# Patient Record
Sex: Female | Born: 1965 | Race: White | Hispanic: No | Marital: Married | Smoking: Current every day smoker
Health system: Southern US, Community
[De-identification: ages and names within clinical notes are randomized; demographics above are authoritative.]

## PROBLEM LIST (undated history)

## (undated) DIAGNOSIS — F411 Generalized anxiety disorder: Secondary | ICD-10-CM

## (undated) DIAGNOSIS — E785 Hyperlipidemia, unspecified: Secondary | ICD-10-CM

## (undated) DIAGNOSIS — F32A Depression, unspecified: Secondary | ICD-10-CM

## (undated) DIAGNOSIS — F329 Major depressive disorder, single episode, unspecified: Secondary | ICD-10-CM

## (undated) HISTORY — DX: Major depressive disorder, single episode, unspecified: F32.9

## (undated) HISTORY — DX: Generalized anxiety disorder: F41.1

## (undated) HISTORY — PX: OTHER SURGICAL HISTORY: SHX169

## (undated) HISTORY — DX: Depression, unspecified: F32.A

## (undated) HISTORY — DX: Hyperlipidemia, unspecified: E78.5

---

## 2002-04-12 ENCOUNTER — Encounter (HOSPITAL_BASED_OUTPATIENT_CLINIC_OR_DEPARTMENT_OTHER): Payer: Self-pay | Admitting: General Surgery

## 2002-04-16 ENCOUNTER — Ambulatory Visit (HOSPITAL_COMMUNITY): Admission: RE | Admit: 2002-04-16 | Discharge: 2002-04-16 | Payer: Self-pay | Admitting: General Surgery

## 2002-04-16 ENCOUNTER — Encounter (INDEPENDENT_AMBULATORY_CARE_PROVIDER_SITE_OTHER): Payer: Self-pay | Admitting: *Deleted

## 2002-10-24 ENCOUNTER — Ambulatory Visit (HOSPITAL_COMMUNITY): Admission: RE | Admit: 2002-10-24 | Discharge: 2002-10-24 | Payer: Self-pay | Admitting: General Surgery

## 2003-02-19 ENCOUNTER — Other Ambulatory Visit: Admission: RE | Admit: 2003-02-19 | Discharge: 2003-02-19 | Payer: Self-pay | Admitting: Family Medicine

## 2006-10-26 ENCOUNTER — Other Ambulatory Visit: Admission: RE | Admit: 2006-10-26 | Discharge: 2006-10-26 | Payer: Self-pay | Admitting: Family Medicine

## 2006-11-08 ENCOUNTER — Encounter: Admission: RE | Admit: 2006-11-08 | Discharge: 2006-11-08 | Payer: Self-pay | Admitting: Family Medicine

## 2007-08-21 ENCOUNTER — Encounter: Admission: RE | Admit: 2007-08-21 | Discharge: 2007-08-21 | Payer: Self-pay | Admitting: Family Medicine

## 2011-05-06 NOTE — Op Note (Signed)
[place]  Patient:    Brianna Sanchez, Brianna Sanchez Visit Number: [id number] MRN: [id number]          Service Type: DSU Location: [facility] 2550 05 Attending Physician:  Sonda Primes Dictated by:   Mardene Celeste. Lurene Shadow, M.D. Proc. Date: 04/16/02 Admit Date:  04/16/2002 Discharge Date: 04/16/2002   CC:         Montey Hora, M.D.   Operative Report  PREOPERATIVE DIAGNOSIS:  Internal and external hemorrhoidal disease.  POSTOPERATIVE DIAGNOSIS:  Internal and external hemorrhoidal disease.  OPERATION PERFORMED:  Examination under anesthesia and proctosigmoidoscopy to 15 cm and hemorrhoidectomy.  SURGEON:  Mardene Celeste. Lurene Shadow, M.D.  ASSISTANT:  Nurse.  ANESTHESIA:  General.  INDICATIONS FOR PROCEDURE:  The patient is a 45 year old woman with a 15-year history of persistent and probably worsening hemorrhoidal disease.  On examination, she has stage 4 hemorrhoids with prolapse in all quadrants of her internal and external hemorrhoids.  She is brought to the operating room now for proctosigmoidoscopy and hemorrhoidectomy.  DESCRIPTION OF PROCEDURE:  Following the induction of a satisfactory general anesthetic with the patient positioned in the prone jackknife position, I inserted the proctosigmoidoscope into the anus and followed it up to approximately 15 cm.  At this point there was a significant amount of retained stool within the sigmoid and the scope could not be advanced further.  The perianal tissue was then prepped and draped to be included in the sterile operative field and the perianal tissue was then infiltrated with 1% Xylocaine with epinephrine 1:200,000.  The operating anoscope was inserted.  The hemorrhoids were removed in three groups in the 5 oclock position, 8 oclock position and the 2 oclock position by infiltrating each hemorrhoid individually with 1% Xylocaine with epinephrine and putting a traction suture at the base of the  hemorrhoid and making an elliptical incision around the hemorrhoid, carrying the incision down over the hemorrhoidal tissues down to the ____________ across the mucocutaneous junction.  The hemorrhoid was then carefully dissected free from the central muscles, removed and forwarded for pathologic evaluation.  Hemostasis was obtained with electrocautery and the mucosa reapproximated with running 2-0 chromic suture down across the mucocutaneous junction.  All there groups of hemorrhoids were treated in the same fashion.  At the end of the procedure, all areas of dissection were checked for hemostasis and noted to be dry. I used strips of Gelfoam soaked in Marcaine with epinephrine to place over each of the incisions.  I then injected the perianal tissues with 0.5% Marcaine with epinephrine 1:200,000. Sterile dressing was then applied.  Anesthetic reversed.  Patient removed from the operating room to the recovery room in stable condition having tolerated the procedure well. Dictated by:   Mardene Celeste. Lurene Shadow, M.D. Attending Physician:  Sonda Primes DD:  04/16/02 TD:  04/16/02 Job: [id number]

## 2012-04-27 DIAGNOSIS — Z0000 Encounter for general adult medical examination without abnormal findings: Secondary | ICD-10-CM | POA: Diagnosis not present

## 2012-04-27 DIAGNOSIS — I10 Essential (primary) hypertension: Secondary | ICD-10-CM | POA: Diagnosis not present

## 2012-04-27 DIAGNOSIS — E559 Vitamin D deficiency, unspecified: Secondary | ICD-10-CM | POA: Diagnosis not present

## 2012-05-01 DIAGNOSIS — Z124 Encounter for screening for malignant neoplasm of cervix: Secondary | ICD-10-CM | POA: Diagnosis not present

## 2012-05-01 DIAGNOSIS — Z01419 Encounter for gynecological examination (general) (routine) without abnormal findings: Secondary | ICD-10-CM | POA: Diagnosis not present

## 2012-07-30 DIAGNOSIS — F329 Major depressive disorder, single episode, unspecified: Secondary | ICD-10-CM | POA: Diagnosis not present

## 2012-07-30 DIAGNOSIS — E785 Hyperlipidemia, unspecified: Secondary | ICD-10-CM | POA: Diagnosis not present

## 2012-07-30 DIAGNOSIS — I10 Essential (primary) hypertension: Secondary | ICD-10-CM | POA: Diagnosis not present

## 2013-03-21 ENCOUNTER — Other Ambulatory Visit: Payer: Self-pay | Admitting: *Deleted

## 2013-03-21 MED ORDER — ALPRAZOLAM 1 MG PO TABS: ORAL_TABLET | ORAL | Status: DC

## 2013-03-21 NOTE — Telephone Encounter (Signed)
Last seen 07/30/12, last  Filled 02/18/13 Must be called into [facility] pharmacy, let pt know when done

## 2013-03-21 NOTE — Telephone Encounter (Signed)
Called rx to [facility] pharmacy

## 2013-03-21 NOTE — Telephone Encounter (Signed)
Please call in rx

## 2013-04-22 ENCOUNTER — Other Ambulatory Visit: Payer: Self-pay | Admitting: *Deleted

## 2013-04-22 MED ORDER — ALPRAZOLAM 1 MG PO TABS: ORAL_TABLET | ORAL | Status: DC

## 2013-04-22 NOTE — Telephone Encounter (Signed)
Rx sent to pharmacy   

## 2013-04-22 NOTE — Telephone Encounter (Signed)
Patient last seen in office on 07-30-12. Rx last filled on 03-21-13 for #60. Please advise. If approved please have nurse phone in to [facility]. Thank you

## 2013-04-23 NOTE — Telephone Encounter (Signed)
Called rx to [facility] 

## 2013-05-24 ENCOUNTER — Telehealth: Payer: Self-pay | Admitting: Nurse Practitioner

## 2013-05-27 ENCOUNTER — Other Ambulatory Visit: Payer: Self-pay | Admitting: Nurse Practitioner

## 2013-05-27 MED ORDER — ALPRAZOLAM 1 MG PO TABS: ORAL_TABLET | ORAL | Status: DC

## 2013-05-27 NOTE — Telephone Encounter (Signed)
rx called in and pt notified. 

## 2013-05-27 NOTE — Telephone Encounter (Signed)
LAST RF 04/26/13. LAST OV 8/13. CALL IN [facility] IF APPROVED. [phone]

## 2013-05-27 NOTE — Telephone Encounter (Signed)
Please call in xanax rx with 2 refills 

## 2013-05-28 NOTE — Telephone Encounter (Signed)
Called in.

## 2013-05-28 NOTE — Telephone Encounter (Signed)
Called to [facility] 

## 2013-06-25 ENCOUNTER — Telehealth: Payer: Self-pay | Admitting: Nurse Practitioner

## 2013-06-27 NOTE — Telephone Encounter (Signed)
Husband has been going to the [place] and is testing positive for Hepatitis B and she is wanting to know if she can come in and be tested for Hepatitis herself. Patient hasnt been seen in office in almost a year so I went ahead and scheduled her for an appt on Monday the 14th with MMM

## 2013-07-01 ENCOUNTER — Ambulatory Visit (INDEPENDENT_AMBULATORY_CARE_PROVIDER_SITE_OTHER): Payer: Medicare Other | Admitting: Nurse Practitioner

## 2013-07-01 ENCOUNTER — Encounter: Payer: Self-pay | Admitting: Nurse Practitioner

## 2013-07-01 VITALS — BP 114/80 | HR 81 | Temp 99.2°F | Ht 66.0 in | Wt 200.0 lb

## 2013-07-01 DIAGNOSIS — F329 Major depressive disorder, single episode, unspecified: Secondary | ICD-10-CM

## 2013-07-01 DIAGNOSIS — R1011 Right upper quadrant pain: Secondary | ICD-10-CM

## 2013-07-01 DIAGNOSIS — Z20828 Contact with and (suspected) exposure to other viral communicable diseases: Secondary | ICD-10-CM

## 2013-07-01 DIAGNOSIS — Z205 Contact with and (suspected) exposure to viral hepatitis: Secondary | ICD-10-CM

## 2013-07-01 DIAGNOSIS — E785 Hyperlipidemia, unspecified: Secondary | ICD-10-CM

## 2013-07-01 DIAGNOSIS — F411 Generalized anxiety disorder: Secondary | ICD-10-CM | POA: Diagnosis not present

## 2013-07-01 MED ORDER — VENLAFAXINE HCL ER 150 MG PO TB24: 150.0000 mg | ORAL_TABLET | Freq: Every day | ORAL | Status: DC

## 2013-07-01 MED ORDER — ALPRAZOLAM 1 MG PO TABS: ORAL_TABLET | ORAL | Status: DC

## 2013-07-01 MED ORDER — ATORVASTATIN CALCIUM 40 MG PO TABS: 40.0000 mg | ORAL_TABLET | Freq: Every day | ORAL | Status: DC

## 2013-07-01 NOTE — Patient Instructions (Signed)
Hepatitis B  Hepatitis B is a viral infection of the liver. Over half the people who become infected with hepatitis B never feel sick. However, some may later develop long-term liver disease (chronic hepatitis). There are 2 phases of the disease: sudden (acute) and longstanding (chronic).  CAUSES  Hepatitis B is caused by the hepatitis B virus (HBV). It can enter the body by sharing needles contaminated with blood from an infected person, by sharing intimate items such as toothbrushes and razors, or by sex with an infected person. A baby can get HBV from its birth mother. A caregiver may also get it from exposure to the blood of an infected patient by way of a cut or needle stick.   SYMPTOMS  Acute Phase  Many cases of acute HBV infection are mild and cause few problems. Some people may not even realize they are sick. Symptoms in others may last a few weeks to several months and include:  · Loss of appetite.  · Feeling very tired.  · Nausea.  · Vomiting.  · Abdominal pain.  · Dark yellow urine.  · Yellow skin and eyes (jaundice).  Chronic Phase  · About 5% of people who get HBV infection become "chronic carriers." They often have no symptoms, but the virus stays in their body. They may spread the virus to others and can get long-term liver disease. The younger a child is when the infection starts, the more likely that child will be a carrier.  · About 25% of chronic HBV carriers get a disease called "chronic active hepatitis." These people may develop scarring of the liver (cirrhosis), liver failure, or liver cancer.  DIAGNOSIS  Your caregiver can do a blood test to see if you have the disease.  TREATMENT  Acute hepatitis B does not usually require any drug treatment. It is important to avoid medicines such as acetaminophen that may cause increasing liver damage.   Treatment with many antiviral drugs is available and recommended for some patients with hepatitis B infection. The goal is to reduce the risk of  progressive chronic liver disease, transmission of infection to others, and other long-term complications such as cirrhosis, liver failure, and liver cancer. Drug treatment is often advised for people with:  · Acute liver failure.  · Clinical complications of cirrhosis.  · Cirrhosis or advanced fibrosis with high serum measurements of viral DNA.  · Reactivation of chronic HBV after chemotherapy or immunosuppression.  Immediate drug treatment is not often advised for patients who have chronic infection but normal liver enzyme tests or patients who have a positive hepatitis B DNA test in blood but no other signs of active infection. Patients may have other circumstances that suggest a need or potential benefit from drug treatment.  Successful treatment currently requires taking treatment drugs over a long period of time. An injected drug (interferon) may be given daily, 3 times a week, or once weekly for up to 1 year. An oral drug treatment plan may require daily dosing for many years or indefinitely, in order to prevent infection reactivation and worsening of liver disease. Side effects from these drugs are common and some may be very serious. Your response to treatment must be carefully monitored by both you and your caregiver throughout the entire treatment period.  PREVENTION  Hepatitis B vaccine is highly effective in preventing a hepatitis B infection. The vaccine is recommended worldwide for all newborns of hepatitis B infected mothers, and in many countries for all newborns. Hepatitis B vaccine is also recommended   in the [place] for other people at higher than normal risk of getting an infection, including:  · Sexually active people with multiple sex partners.  · Homosexual and bisexual men.  · People who live with someone who has hepatitis B.  · Injection drug users.  · Healthcare workers.  · Patients on chronic hemodialysis and patients who need repeated blood or blood product transfusions.  · Patients with  chronic liver disease due to any cause.  · Unvaccinated people traveling to areas with high levels of local HBV infection.  · Patients with diabetes.  Hepatitis B immune globulin (HBIG) is often given with hepatitis B vaccine to people who have been exposed to blood contaminated with HBV. The HBIG protects you from the virus for the first 1 to 3 months. After that, the hepatitis B vaccine takes over and gives you long-term protection. Your caregiver will help you decide whether and when to get these shots following exposure to HBV.  Healthcare workers need to avoid injuries and wear appropriate protective equipment such as gloves, gowns, and face masks when performing invasive medical or nursing procedures.   HOME CARE INSTRUCTIONS   · Rest when you feel tired, and eat when you are hungry.  · Avoid a sexual relationship until advised otherwise by your caregiver.  · Avoid activities that could expose other people to your blood. Examples include sharing a toothbrush, nail clippers, razors, and needles.  · This infection is contagious. Follow your caregiver's instructions in order to avoid spread of the infection.  · Do not take any medicines until your caregiver says it is okay. This includes over-the-counter drugs such as acetominophen that are usually taken for fever or pain.  SEEK IMMEDIATE MEDICAL CARE IF:   · You are unable to eat or drink.  · You feel sick to your stomach (nauseous) or throw up (vomit).  · You feel confused.  · Jaundice becomes more severe.  · You have trouble breathing, a rash, or swelling of the skin, throat, mouth, or face. You may be having an allergic reaction to the medicine in the shot.  · You start twitching or shaking (seizure).  · You become very sleepy or have trouble waking up.  MAKE SURE YOU:   · Understand these instructions.  · Will watch your condition.  · Will get help right away if you are not doing well or get worse.  Document Released: 12/02/2000 Document Revised: 02/27/2012  Document Reviewed: 04/05/2011  ExitCare® Patient Information ©2014 [employer].

## 2013-07-01 NOTE — Progress Notes (Signed)
Subjective:    Patient ID: Brianna Sanchez, female    DOB: 07-04-1966, 47 y.o.   MRN: [id number]  Hyperlipidemia This is a chronic problem. The current episode started more than 1 year ago. The problem is uncontrolled. Recent lipid tests were reviewed and are high. Exacerbating diseases include obesity. She has no history of diabetes or hypothyroidism. There are no known factors aggravating her hyperlipidemia. Pertinent negatives include no focal sensory loss, leg pain, myalgias or shortness of breath. Current antihyperlipidemic treatment includes statins. The current treatment provides moderate improvement of lipids. Compliance problems include adherence to diet and adherence to exercise.  Risk factors for coronary artery disease include obesity and post-menopausal.  Gad/Depression Currently on effexor and xanax- she is doing okay- still doesn't like getting out  Of house and doing things- but says she doesn't want to change anything.  * Her husband is a patient at the [place]- He was told he had signs of hepatoitis despite labs coming back normal and seeing a specialist and them saying he was oK- They told her she needed to be tested. Patient has no symptoms of hepatitis other than intermittent right upper quadrant pain.  Review of Systems  Constitutional: Negative for fever, activity change, appetite change, fatigue and unexpected weight change.  HENT: Negative.   Eyes: Negative.   Respiratory: Negative.  Negative for shortness of breath.   Cardiovascular: Negative.   Gastrointestinal: Negative.   Genitourinary: Negative.   Musculoskeletal: Negative.  Negative for myalgias.  Neurological: Negative.   Hematological: Negative.   Psychiatric/Behavioral: Negative.        Objective:   Physical Exam  Constitutional: She is oriented to person, place, and time. She appears well-developed and well-nourished.  HENT:  Nose: Nose normal.  Mouth/Throat: Oropharynx is clear and moist.  Eyes: EOM are  normal.  Neck: Trachea normal, normal range of motion and full passive range of motion without pain. Neck supple. No JVD present. Carotid bruit is not present. No thyromegaly present.  Cardiovascular: Normal rate, regular rhythm, normal heart sounds and intact distal pulses.  Exam reveals no gallop and no friction rub.   No murmur heard. Pulmonary/Chest: Effort normal and breath sounds normal.  Abdominal: Soft. Bowel sounds are normal. She exhibits no distension and no mass. There is no tenderness (mild right upper quadrant pain on palpation).  Musculoskeletal: Normal range of motion.  Lymphadenopathy:    She has no cervical adenopathy.  Neurological: She is alert and oriented to person, place, and time. She has normal reflexes.  Skin: Skin is warm and dry.  Psychiatric: She has a normal mood and affect. Her behavior is normal. Judgment and thought content normal.     BP 114/80  Pulse 81  Temp(Src) 99.2 F (37.3 C) (Oral)  Ht 5\' 6"  (1.676 m)  Wt 200 lb (90.719 kg)  BMI 32.3 kg/m2      Assessment & Plan:  1. Hyperlipidemia Low fat diet an dexercise - COMPLETE METABOLIC PANEL WITH GFR - NMR Lipoprofile with Lipids - atorvastatin (LIPITOR) 40 MG tablet; Take 1 tablet (40 mg total) by mouth daily.  Dispense: 30 tablet; Refill: 5  2. GAD (generalized anxiety disorder) Stress management - ALPRAZolam (XANAX) 1 MG tablet; Take 1 tab po bid  Dispense: 60 tablet; Refill: 1  3. Depression  - Venlafaxine HCl 150 MG TB24; Take 1 tablet (150 mg total) by mouth daily.  Dispense: 30 each; Refill: 3  4. Exposure to hepatitis  - Hepatitis panel, acute  5.  Abdominal pain, right upper quadrant  Labs pending - Hepatitis panel, acute  Brianna Daphine Deutscher, FNP

## 2013-07-02 LAB — COMPLETE METABOLIC PANEL WITH GFR
ALT: 26 U/L (ref 0–35)
Alkaline Phosphatase: 96 U/L (ref 39–117)
Sodium: 140 mEq/L (ref 135–145)
Total Bilirubin: 0.4 mg/dL (ref 0.3–1.2)
Total Protein: 7.1 g/dL (ref 6.0–8.3)

## 2013-07-02 LAB — NMR LIPOPROFILE WITH LIPIDS
HDL Particle Number: 38.7 umol/L (ref >=30.5)
HDL Size: 8.6 nm — ABNORMAL LOW (ref >=9.2)
HDL-C: 47 mg/dL (ref >=40)
LDL (calc): 75 mg/dL (ref <100)
LP-IR Score: 73 — ABNORMAL HIGH (ref <=45)
Large HDL-P: 4.2 umol/L — ABNORMAL LOW (ref >=4.8)
Triglycerides: 106 mg/dL (ref <150)

## 2013-07-03 ENCOUNTER — Telehealth: Payer: Self-pay | Admitting: Nurse Practitioner

## 2013-07-03 LAB — HEPATITIS PANEL, ACUTE
HCV Ab: NEGATIVE
Hep B C IgM: NEGATIVE
Hepatitis B Surface Ag: NEGATIVE

## 2013-07-04 NOTE — Telephone Encounter (Signed)
Results not available yet.

## 2013-07-04 NOTE — Telephone Encounter (Signed)
Patient notified that results not ready yet

## 2013-07-09 NOTE — Telephone Encounter (Signed)
Discussed lab results with pt  

## 2013-09-26 ENCOUNTER — Other Ambulatory Visit: Payer: Self-pay

## 2013-09-26 DIAGNOSIS — F411 Generalized anxiety disorder: Secondary | ICD-10-CM

## 2013-09-26 MED ORDER — ALPRAZOLAM 1 MG PO TABS: ORAL_TABLET | ORAL | Status: DC

## 2013-09-26 NOTE — Telephone Encounter (Signed)
Last seen 07/01/13  MMM  If approved route to nurse to phone into [facility] [phone]

## 2013-09-26 NOTE — Telephone Encounter (Signed)
Please call in xanax rx with 1 refill 

## 2013-09-26 NOTE — Telephone Encounter (Signed)
Called to [facility] 

## 2013-11-25 ENCOUNTER — Other Ambulatory Visit: Payer: Self-pay | Admitting: *Deleted

## 2013-11-25 DIAGNOSIS — F411 Generalized anxiety disorder: Secondary | ICD-10-CM

## 2013-11-25 MED ORDER — ALPRAZOLAM 1 MG PO TABS: ORAL_TABLET | ORAL | Status: DC

## 2013-11-25 NOTE — Telephone Encounter (Signed)
Please call in xanax rx with 1 refill 

## 2013-11-25 NOTE — Telephone Encounter (Signed)
Last seen 07/01/13, last filled 10/25/13. Pt uses [facility] [phone]

## 2013-11-26 NOTE — Telephone Encounter (Signed)
Refill given to pharmacist at [facility].

## 2013-12-30 ENCOUNTER — Telehealth: Payer: Self-pay | Admitting: Nurse Practitioner

## 2013-12-30 NOTE — Telephone Encounter (Signed)
Apt made

## 2013-12-31 ENCOUNTER — Encounter: Payer: Self-pay | Admitting: Nurse Practitioner

## 2013-12-31 ENCOUNTER — Ambulatory Visit (INDEPENDENT_AMBULATORY_CARE_PROVIDER_SITE_OTHER): Payer: Medicare Other | Admitting: Nurse Practitioner

## 2013-12-31 VITALS — BP 106/55 | HR 77 | Temp 99.0°F | Ht 66.0 in | Wt 200.0 lb

## 2013-12-31 DIAGNOSIS — B353 Tinea pedis: Secondary | ICD-10-CM

## 2013-12-31 MED ORDER — CLOTRIMAZOLE-BETAMETHASONE 1-0.05 % EX CREA: 1.0000 "application " | TOPICAL_CREAM | Freq: Two times a day (BID) | CUTANEOUS | Status: DC

## 2013-12-31 NOTE — Progress Notes (Signed)
   Subjective:    Patient ID: Brianna Sanchez, female    DOB: 04/19/1966, 48 y.o.   MRN: [id number]  HPI Patient here today c/o rash on feet- started about 2-3 years ago and comes and goes- Has been bad for several weeks. Has tried lots of OTC treatments which have kinda made things worse. They itch burn and are cracked open.    Review of Systems  Constitutional: Negative.   Eyes: Negative.   Cardiovascular: Negative.   All other systems reviewed and are negative.       Objective:   Physical Exam  Constitutional: She appears well-developed and well-nourished.  Cardiovascular: Normal rate, regular rhythm and normal heart sounds.   Pulmonary/Chest: Effort normal and breath sounds normal.  Skin: Skin is warm. Rash noted.  Dry cracked open skin around both heels and on bottom of feet. Mild erythema in some places.          Assessment & Plan:   1. Tinea pedis, recurrent    Meds ordered this encounter  Medications  . clotrimazole-betamethasone (LOTRISONE) cream    Sig: Apply 1 application topically 2 (two) times daily.    Dispense:  45 g    Refill:  2    Order Specific Question:  Supervising Provider    Answer:  Chipper Herb [[id number]]   Avoid scratching and PICKING Soak in epsom salt 2X a day Keep feet dry Call if no better and will do derm referral  [place], FNP

## 2013-12-31 NOTE — Patient Instructions (Signed)
Athlete's Foot Athlete's foot (tinea pedis) is a fungal infection of the skin on the feet. It often occurs on the skin between the toes or underneath the toes. It can also occur on the soles of the feet. Athlete's foot is more likely to occur in hot, humid weather. Not washing your feet or changing your socks often enough can contribute to athlete's foot. The infection can spread from person to person (contagious). CAUSES Athlete's foot is caused by a fungus. This fungus thrives in warm, moist places. Most people get athlete's foot by sharing shower stalls, towels, and wet floors with an infected person. People with weakened immune systems, including those with diabetes, may be more likely to get athlete's foot. SYMPTOMS   Itchy areas between the toes or on the soles of the feet.  White, flaky, or scaly areas between the toes or on the soles of the feet.  Tiny, intensely itchy blisters between the toes or on the soles of the feet.  Tiny cuts on the skin. These cuts can develop a bacterial infection.  Thick or discolored toenails. DIAGNOSIS  Your caregiver can usually tell what the problem is by doing a physical exam. Your caregiver may also take a skin sample from the rash area. The skin sample may be examined under a microscope, or it may be tested to see if fungus will grow in the sample. A sample may also be taken from your toenail for testing. TREATMENT  Over-the-counter and prescription medicines can be used to kill the fungus. These medicines are available as powders or creams. Your caregiver can suggest medicines for you. Fungal infections respond slowly to treatment. You may need to continue using your medicine for several weeks. PREVENTION   Do not share towels.  Wear sandals in wet areas, such as shared locker rooms and shared showers.  Keep your feet dry. Wear shoes that allow air to circulate. Wear cotton or wool socks. HOME CARE INSTRUCTIONS   Take medicines as directed by  your caregiver. Do not use steroid creams on athlete's foot.  Keep your feet clean and cool. Wash your feet daily and dry them thoroughly, especially between your toes.  Change your socks every day. Wear cotton or wool socks. In hot climates, you may need to change your socks 2 to 3 times per day.  Wear sandals or canvas tennis shoes with good air circulation.  If you have blisters, soak your feet in Burow's solution or Epsom salts for 20 to 30 minutes, 2 times a day to dry out the blisters. Make sure you dry your feet thoroughly afterward. SEEK MEDICAL CARE IF:   You have a fever.  You have swelling, soreness, warmth, or redness in your foot.  You are not getting better after 7 days of treatment.  You are not completely cured after 30 days.  You have any problems caused by your medicines. MAKE SURE YOU:   Understand these instructions.  Will watch your condition.  Will get help right away if you are not doing well or get worse. Document Released: 12/02/2000 Document Revised: 02/27/2012 Document Reviewed: 09/23/2011 ExitCare Patient Information 2014 [employer].  

## 2014-01-16 ENCOUNTER — Telehealth: Payer: Self-pay | Admitting: Nurse Practitioner

## 2014-01-16 DIAGNOSIS — F32A Depression, unspecified: Secondary | ICD-10-CM

## 2014-01-16 DIAGNOSIS — F329 Major depressive disorder, single episode, unspecified: Secondary | ICD-10-CM

## 2014-01-17 MED ORDER — VENLAFAXINE HCL ER 150 MG PO TB24: 150.0000 mg | ORAL_TABLET | Freq: Every day | ORAL | Status: DC

## 2014-01-17 NOTE — Telephone Encounter (Signed)
done

## 2014-01-22 ENCOUNTER — Other Ambulatory Visit: Payer: Self-pay | Admitting: *Deleted

## 2014-01-22 DIAGNOSIS — F411 Generalized anxiety disorder: Secondary | ICD-10-CM

## 2014-01-22 MED ORDER — ALPRAZOLAM 1 MG PO TABS: ORAL_TABLET | ORAL | Status: DC

## 2014-01-22 MED ORDER — CLOTRIMAZOLE-BETAMETHASONE 1-0.05 % EX CREA: 1.0000 "application " | TOPICAL_CREAM | Freq: Two times a day (BID) | CUTANEOUS | Status: DC

## 2014-01-22 NOTE — Telephone Encounter (Addendum)
Patient last seen in office on 12-31-13. Rx last filled on 12-25-13 for #60. Please advise. If approved please route to [facility] so nurse can phone in to [facility] at [phone]. Clotrimazole last filled on 01-16-14. Please advise on that as well

## 2014-01-22 NOTE — Telephone Encounter (Signed)
rx called into pharmacy

## 2014-01-22 NOTE — Telephone Encounter (Signed)
Please call in xanax with 1 refills 

## 2014-03-18 ENCOUNTER — Other Ambulatory Visit: Payer: Self-pay | Admitting: Nurse Practitioner

## 2014-03-18 ENCOUNTER — Telehealth: Payer: Self-pay | Admitting: Nurse Practitioner

## 2014-03-18 DIAGNOSIS — E785 Hyperlipidemia, unspecified: Secondary | ICD-10-CM

## 2014-03-18 NOTE — Telephone Encounter (Signed)
Hasn't had lab work in ordr to [facility].Patient NTBS for follow up and lab work

## 2014-03-18 NOTE — Telephone Encounter (Signed)
Patient aware.

## 2014-03-18 NOTE — Telephone Encounter (Signed)
Patient is aware has set up an appointment but cant get it until late next week in the afternoon can you put lab work order in so she can get her labs done earlier and she can it

## 2014-03-18 NOTE — Telephone Encounter (Signed)
ok 

## 2014-03-18 NOTE — Telephone Encounter (Signed)
She can wait until apppointment. So won;t have to make 2 trips- she will be okay to be out of lipitor for a few days.

## 2014-03-18 NOTE — Telephone Encounter (Signed)
She wants lab work put in for that morning so she can eat before her appointment because she said she cant go that long with out eating

## 2014-03-20 ENCOUNTER — Other Ambulatory Visit: Payer: Self-pay | Admitting: *Deleted

## 2014-03-20 DIAGNOSIS — E785 Hyperlipidemia, unspecified: Secondary | ICD-10-CM

## 2014-03-20 MED ORDER — ATORVASTATIN CALCIUM 40 MG PO TABS: 40.0000 mg | ORAL_TABLET | Freq: Every day | ORAL | Status: DC

## 2014-03-24 ENCOUNTER — Other Ambulatory Visit (INDEPENDENT_AMBULATORY_CARE_PROVIDER_SITE_OTHER): Payer: Medicare Other

## 2014-03-24 DIAGNOSIS — E785 Hyperlipidemia, unspecified: Secondary | ICD-10-CM | POA: Diagnosis not present

## 2014-03-24 NOTE — Progress Notes (Signed)
Pt came in for labs only 

## 2014-03-26 ENCOUNTER — Encounter: Payer: Self-pay | Admitting: Nurse Practitioner

## 2014-03-26 ENCOUNTER — Ambulatory Visit (INDEPENDENT_AMBULATORY_CARE_PROVIDER_SITE_OTHER): Payer: Medicare Other | Admitting: Nurse Practitioner

## 2014-03-26 VITALS — BP 130/81 | HR 89 | Temp 98.0°F | Ht 66.0 in | Wt 204.0 lb

## 2014-03-26 DIAGNOSIS — F329 Major depressive disorder, single episode, unspecified: Secondary | ICD-10-CM | POA: Diagnosis not present

## 2014-03-26 DIAGNOSIS — F411 Generalized anxiety disorder: Secondary | ICD-10-CM | POA: Diagnosis not present

## 2014-03-26 DIAGNOSIS — E785 Hyperlipidemia, unspecified: Secondary | ICD-10-CM | POA: Diagnosis not present

## 2014-03-26 DIAGNOSIS — F3289 Other specified depressive episodes: Secondary | ICD-10-CM

## 2014-03-26 DIAGNOSIS — F32A Depression, unspecified: Secondary | ICD-10-CM

## 2014-03-26 LAB — CMP14+EGFR
ALK PHOS: 91 IU/L (ref 39–117)
ALT: 24 IU/L (ref 0–32)
AST: 15 IU/L (ref 0–40)
Albumin/Globulin Ratio: 1.9 (ref 1.1–2.5)
Albumin: 4.4 g/dL (ref 3.5–5.5)
BUN / CREAT RATIO: 20 (ref 9–23)
BUN: 14 mg/dL (ref 6–24)
CHLORIDE: 100 mmol/L (ref 97–108)
CO2: 22 mmol/L (ref 18–29)
Calcium: 9.7 mg/dL (ref 8.7–10.2)
Creatinine, Ser: 0.7 mg/dL (ref 0.57–1.00)
GFR, EST AFRICAN AMERICAN: 118 mL/min/{1.73_m2} (ref >59)
GFR, EST NON AFRICAN AMERICAN: 102 mL/min/{1.73_m2} (ref >59)
Globulin, Total: 2.3 g/dL (ref 1.5–4.5)
Glucose: 108 mg/dL — ABNORMAL HIGH (ref 65–99)
Potassium: 4.7 mmol/L (ref 3.5–5.2)
Sodium: 138 mmol/L (ref 134–144)
TOTAL PROTEIN: 6.7 g/dL (ref 6.0–8.5)
Total Bilirubin: 0.4 mg/dL (ref 0.0–1.2)

## 2014-03-26 LAB — NMR, LIPOPROFILE
CHOLESTEROL: 166 mg/dL (ref <200)
HDL Cholesterol by NMR: 48 mg/dL (ref >=40)
HDL Particle Number: 34 umol/L (ref >=30.5)
LDL PARTICLE NUMBER: 1171 nmol/L — AB (ref <1000)
LDL SIZE: 20.7 nm (ref >20.5)
LDLC SERPL CALC-MCNC: 89 mg/dL (ref <100)
LP-IR SCORE: 73 — AB (ref <=45)
SMALL LDL PARTICLE NUMBER: 485 nmol/L (ref <=527)
Triglycerides by NMR: 146 mg/dL (ref <150)

## 2014-03-26 MED ORDER — ALPRAZOLAM 1 MG PO TABS: ORAL_TABLET | ORAL | Status: DC

## 2014-03-26 MED ORDER — CLOTRIMAZOLE-BETAMETHASONE 1-0.05 % EX CREA: 1.0000 "application " | TOPICAL_CREAM | Freq: Two times a day (BID) | CUTANEOUS | Status: DC

## 2014-03-26 MED ORDER — ATORVASTATIN CALCIUM 40 MG PO TABS: 40.0000 mg | ORAL_TABLET | Freq: Every day | ORAL | Status: DC

## 2014-03-26 MED ORDER — VENLAFAXINE HCL ER 150 MG PO TB24: 150.0000 mg | ORAL_TABLET | Freq: Every day | ORAL | Status: DC

## 2014-03-26 NOTE — Progress Notes (Signed)
Subjective:    Patient ID: Brianna Sanchez, female    DOB: [birth year], 48 y.o.   MRN: [id number]  Patient here today for follow up of chronic medical problems. Doing well   Hyperlipidemia This is a chronic problem. The current episode started more than 1 year ago. The problem is uncontrolled. Recent lipid tests were reviewed and are high. Exacerbating diseases include obesity. She has no history of diabetes or hypothyroidism. There are no known factors aggravating her hyperlipidemia. Pertinent negatives include no focal sensory loss, leg pain, myalgias or shortness of breath. Current antihyperlipidemic treatment includes statins. The current treatment provides moderate improvement of lipids. Compliance problems include adherence to diet and adherence to exercise.  Risk factors for coronary artery disease include obesity and post-menopausal.  Gad/Depression Currently on effexor and xanax- she is doing okay- still doesn't like getting out  Of house and doing things- but says she doesn't want to change anything.  * Review of Systems  Constitutional: Negative for fever, activity change, appetite change, fatigue and unexpected weight change.  HENT: Negative.   Eyes: Negative.   Respiratory: Negative.  Negative for shortness of breath.   Cardiovascular: Negative.   Gastrointestinal: Negative.   Genitourinary: Negative.   Musculoskeletal: Negative.  Negative for myalgias.  Neurological: Negative.   Hematological: Negative.   Psychiatric/Behavioral: Negative.        Objective:   Physical Exam  Constitutional: She is oriented to person, place, and time. She appears well-developed and well-nourished.  HENT:  Nose: Nose normal.  Mouth/Throat: Oropharynx is clear and moist.  Eyes: EOM are normal.  Neck: Trachea normal, normal range of motion and full passive range of motion without pain. Neck supple. No JVD present. Carotid bruit is not present. No thyromegaly present.  Cardiovascular: Normal  rate, regular rhythm, normal heart sounds and intact distal pulses.  Exam reveals no gallop and no friction rub.   No murmur heard. Pulmonary/Chest: Effort normal and breath sounds normal.  Abdominal: Soft. Bowel sounds are normal. She exhibits no distension and no mass. There is no tenderness (mild right upper quadrant pain on palpation).  Musculoskeletal: Normal range of motion.  Lymphadenopathy:    She has no cervical adenopathy.  Neurological: She is alert and oriented to person, place, and time. She has normal reflexes.  Skin: Skin is warm and dry.  Psychiatric: She has a normal mood and affect. Her behavior is normal. Judgment and thought content normal.     BP 130/81  Pulse 89  Temp(Src) 98 F (36.7 C) (Oral)  Ht 5\' 6"  (1.676 m)  Wt 204 lb (92.534 kg)  BMI 32.94 kg/m2      Assessment & Plan:   1. Hyperlipidemia   2. GAD (generalized anxiety disorder)   3. Depression     Meds ordered this encounter  Medications  . Venlafaxine HCl 150 MG TB24    Sig: Take 1 tablet (150 mg total) by mouth daily.    Dispense:  30 each    Refill:  5    Order Specific Question:  Supervising Provider    Answer:  Chipper Herb [[id number]]  . ALPRAZolam (XANAX) 1 MG tablet    Sig: Take 1 tab po bid    Dispense:  60 tablet    Refill:  1    Order Specific Question:  Supervising Provider    Answer:  Chipper Herb [[id number]]  . atorvastatin (LIPITOR) 40 MG tablet    Sig: Take 1 tablet (40 mg total) by  mouth daily.    Dispense:  30 tablet    Refill:  5    Order Specific Question:  Supervising Provider    Answer:  Chipper Herb [[id number]]   Patient to schedule mammogram on way out Labs pending Health maintenance reviewed Diet and exercise encouraged Continue all meds Follow up  In 6 months   [place], FNP

## 2014-03-26 NOTE — Patient Instructions (Signed)

## 2014-03-26 NOTE — Addendum Note (Signed)
Addended by: Chevis Pretty on: 03/26/2014 11:52 AM   Modules accepted: Orders

## 2014-04-22 ENCOUNTER — Encounter: Payer: Self-pay | Admitting: *Deleted

## 2014-05-27 ENCOUNTER — Other Ambulatory Visit: Payer: Self-pay | Admitting: *Deleted

## 2014-05-27 DIAGNOSIS — F411 Generalized anxiety disorder: Secondary | ICD-10-CM

## 2014-05-27 NOTE — Telephone Encounter (Signed)
Last seen 03/26/14, last filled 04/28/14. Call into [facility] [phone]

## 2014-05-28 MED ORDER — ALPRAZOLAM 1 MG PO TABS: ORAL_TABLET | ORAL | Status: DC

## 2014-05-28 NOTE — Telephone Encounter (Signed)
Please call in xanax with 1 refills 

## 2014-05-28 NOTE — Telephone Encounter (Signed)
Rx called into [facility]

## 2014-06-02 ENCOUNTER — Encounter: Payer: Self-pay | Admitting: Nurse Practitioner

## 2014-06-02 ENCOUNTER — Ambulatory Visit (INDEPENDENT_AMBULATORY_CARE_PROVIDER_SITE_OTHER): Payer: Medicare Other | Admitting: Nurse Practitioner

## 2014-06-02 ENCOUNTER — Other Ambulatory Visit: Payer: Self-pay | Admitting: Nurse Practitioner

## 2014-06-02 ENCOUNTER — Telehealth: Payer: Self-pay | Admitting: Nurse Practitioner

## 2014-06-02 VITALS — BP 119/80 | HR 80 | Temp 98.8°F | Ht 66.0 in | Wt 200.8 lb

## 2014-06-02 DIAGNOSIS — L408 Other psoriasis: Secondary | ICD-10-CM

## 2014-06-02 DIAGNOSIS — L409 Psoriasis, unspecified: Secondary | ICD-10-CM

## 2014-06-02 MED ORDER — FLURANDRENOLIDE 4 MCG/SQCM EX TAPE: 1.0000 | MEDICATED_TAPE | Freq: Two times a day (BID) | CUTANEOUS | Status: DC

## 2014-06-02 MED ORDER — FLURANDRENOLIDE 0.05 % EX CREA: 1.0000 "application " | TOPICAL_CREAM | Freq: Every day | CUTANEOUS | Status: DC

## 2014-06-02 MED ORDER — TRIAMCINOLONE 0.1 % CREAM:EUCERIN CREAM 1:1: 1.0000 "application " | TOPICAL_CREAM | Freq: Two times a day (BID) | CUTANEOUS | Status: DC

## 2014-06-02 NOTE — Telephone Encounter (Signed)
Pt having continued foot pain appt scheduled

## 2014-06-02 NOTE — Patient Instructions (Signed)
Psoriasis Psoriasis is a common, long-lasting (chronic) inflammation of the skin. It affects both men and women equally, of all ages and all races. Psoriasis cannot be passed from person to person (not contagious). Psoriasis varies from mild to very severe. When severe, it can greatly affect your quality of life. Psoriasis is an inflammatory disorder affecting the skin as well as other organs including the joints (causing an arthritis). With psoriasis, the skin sheds its top layer of cells more rapidly than it does in someone without psoriasis. CAUSES  The cause of psoriasis is largely unknown. Genetics, your immune system, and the environment seem to play a role in causing psoriasis. Factors that can make psoriasis worse include:  Damage or trauma to the skin, such as cuts, scrapes, and sunburn. This damage often causes new areas of psoriasis (lesions).  Winter dryness and lack of sunlight.  Medicines such as lithium, beta-blockers, antimalarial drugs, ACE inhibitors, nonsteroidal anti-inflammatory drugs (ibuprofen, aspirin), and terbinafine. Let your caregiver know if you are taking any of these drugs.  Alcohol. Excessive alcohol use should be avoided if you have psoriasis. Drinking large amounts of alcohol can affect:  How well your psoriasis treatment works.  How safe your psoriasis treatment is.  Smoking. If you smoke, ask your caregiver for help to quit.  Stress.  Bacterial or viral infections.  Arthritis. Arthritis associated with psoriasis (psoriatic arthritis) affects less than 10% of patients with psoriasis. The arthritic intensity does not always match the skin psoriasis intensity. It is important to let your caregiver know if your joints hurt or if they are stiff. SYMPTOMS  The most common form of psoriasis begins with little red bumps that gradually become larger. The bumps begin to form scales that flake off easily. The lower layers of scales stick together. When these scales  are scratched or removed, the underlying skin is tender and bleeds easily. These areas then grow in size and may become large. Psoriasis often creates a rash that looks the same on both sides of the body (symmetrical). It often affects the elbows, knees, groin, genitals, arms, legs, scalp, and nails. Affected nails often have pitting, loosen, thicken, crumble, and are difficult to treat.  "Inverse psoriasis"occurs in the armpits, under breasts, in skin folds, and around the groin, buttocks, and genitals.  "Guttate psoriasis" generally occurs in children and young adults following a recent sore throat (strep throat). It begins with many small, red, scaly spots on the skin. It clears spontaneously in weeks or a few months without treatment. DIAGNOSIS  Psoriasis is diagnosed by physical exam. A tissue sample (biopsy) may also be taken. TREATMENT The treatment of psoriasis depends on your age, health, and living conditions.  Steroid (cortisone) creams, lotions, and ointments may be used. These treatments are associated with thinning of the skin, blood vessels that get larger (dilated), loss of skin pigmentation, and easy bruising. It is important to use these steroids as directed by your caregiver. Only treat the affected areas and not the normal, unaffected skin. People on long-term steroid treatment should wear a medical alert bracelet. Injections may be used in areas that are difficult to treat.  Scalp treatments are available as shampoos, solutions, sprays, foams, and oils. Avoid scratching the scalp and picking at the scales.  Anthralin medicine works well on areas that are difficult to treat. However, it stains clothes and skin and may cause temporary irritation.  Synthetic vitamin D (calcipotriene)can be used on small areas. It is available by prescription. The forms   of synthetic vitamin D available in health food stores do not help with psoriasis.  Coal tarsare available in various strengths  for psoriasis that is difficult to treat. They are one of the longest used treatments for difficult to treat psoriasis. However, they are messy to use.  Light therapy (UV therapy) can be carefully and professionally monitored in a dermatologist's office. Careful sunbathing is helpful for many people as directed by your caregiver. The exposure should be just long enough to cause a mild redness (erythema) of your skin. Avoid sunburn as this may make the condition worse. Sunscreen (SPF of 30 or higher) should be used to protect against sunburn. Cataracts, wrinkles, and skin aging are some of the harmful side effects of light therapy.  If creams (topical medicines) fail, there are several other options for systemic or oral medicines your caregiver can suggest. Psoriasis can sometimes be very difficult to treat. It can come and go. It is necessary to follow up with your caregiver regularly if your psoriasis is difficult to treat. Usually, with persistence you can get a good amount of relief. Maintaining consistent care is important. Do not change caregivers just because you do not see immediate results. It may take several trials to find the right combination of treatment for you. PREVENTING FLARE-UPS  Wear gloves while you wash dishes, while cleaning, and when you are outside in the cold.  If you have radiators, place a bowl of water or damp towel on the radiator. This will help put water back in the air. You can also use a humidifier to keep the air moist. Try to keep the humidity at about 60% in your home.  Apply moisturizer while your skin is still damp from bathing or showering. This traps water in the skin.  Avoid long, hot baths or showers. Keep soap use to a minimum. Soaps dry out the skin and wash away the protective oils. Use a fragrance free, dye free soap.  Drink enough water and fluids to keep your urine clear or pale yellow. Not drinking enough water depletes your skin's water  supply.  Turn off the heat at night and keep it low during the day. Cool air is less drying. SEEK MEDICAL CARE IF:  You have increasing pain in the affected areas.  You have uncontrolled bleeding in the affected areas.  You have increasing redness or warmth in the affected areas.  You start to have pain or stiffness in your joints.  You start feeling depressed about your condition.  You have a fever. Document Released: 12/02/2000 Document Revised: 02/27/2012 Document Reviewed: 05/30/2011 ExitCare Patient Information 2014 [employer].  

## 2014-06-02 NOTE — Progress Notes (Signed)
   Subjective:    Patient ID: [name], female    DOB: 1966/04/12, 48 y.o.   MRN: [id number]  HPI Patient in today c/o bil foot pain- started several months ago- Her skin gets thick and craks open- painful to walk on. Has used lotrisone cream which hs not helped- She has soaked in epsom salt and that has not helped either.    Review of Systems  Constitutional: Negative.   HENT: Negative.   Respiratory: Negative.   Cardiovascular: Negative.   Genitourinary: Negative.   Psychiatric/Behavioral: Negative.   All other systems reviewed and are negative.      Objective:   Physical Exam  Constitutional: She is oriented to person, place, and time. She appears well-developed and well-nourished.  Cardiovascular: Normal rate, regular rhythm and normal heart sounds.   Pulmonary/Chest: Effort normal and breath sounds normal.  Neurological: She is alert and oriented to person, place, and time.  Skin:  Thick dry flaky skin on bil heels with fissure plantar surface of left foot.   BP 119/80  Pulse 80  Temp(Src) 98.8 F (37.1 C) (Oral)  Ht 5\' 6"  (1.676 m)  Wt 200 lb 12.8 oz (91.082 kg)  BMI 32.43 kg/m2        Assessment & Plan:  1. Psoriasis listerine foot soak Use tape at bedtime RTO if not improving  - Flurandrenolide 4 MCG/SQCM TAPE; Apply 1 each topically 2 (two) times daily.  Dispense: 1 each; Refill: 2  [name], FNP

## 2014-06-03 DIAGNOSIS — Z1231 Encounter for screening mammogram for malignant neoplasm of breast: Secondary | ICD-10-CM | POA: Diagnosis not present

## 2014-07-29 ENCOUNTER — Other Ambulatory Visit: Payer: Self-pay | Admitting: *Deleted

## 2014-07-29 DIAGNOSIS — F411 Generalized anxiety disorder: Secondary | ICD-10-CM

## 2014-07-29 MED ORDER — ALPRAZOLAM 1 MG PO TABS: ORAL_TABLET | ORAL | Status: DC

## 2014-07-29 NOTE — Telephone Encounter (Signed)
Called in.

## 2014-07-29 NOTE — Telephone Encounter (Signed)
Last ov 6/15. Last refill 06/26/14. If approved call to [facility] [phone].

## 2014-07-29 NOTE — Telephone Encounter (Signed)
Please call in xanax with 1 refills 

## 2014-08-18 ENCOUNTER — Telehealth: Payer: Self-pay | Admitting: Nurse Practitioner

## 2014-08-18 NOTE — Telephone Encounter (Signed)
appt scheduled

## 2014-08-19 ENCOUNTER — Ambulatory Visit (INDEPENDENT_AMBULATORY_CARE_PROVIDER_SITE_OTHER): Payer: Medicare Other | Admitting: Nurse Practitioner

## 2014-08-19 ENCOUNTER — Encounter: Payer: Self-pay | Admitting: Nurse Practitioner

## 2014-08-19 VITALS — BP 120/84 | HR 95 | Temp 98.0°F | Ht 66.0 in | Wt 198.4 lb

## 2014-08-19 DIAGNOSIS — F3289 Other specified depressive episodes: Secondary | ICD-10-CM

## 2014-08-19 DIAGNOSIS — F329 Major depressive disorder, single episode, unspecified: Secondary | ICD-10-CM

## 2014-08-19 DIAGNOSIS — F32A Depression, unspecified: Secondary | ICD-10-CM

## 2014-08-19 DIAGNOSIS — F411 Generalized anxiety disorder: Secondary | ICD-10-CM

## 2014-08-19 MED ORDER — ALPRAZOLAM 1 MG PO TABS: ORAL_TABLET | ORAL | Status: DC

## 2014-08-19 MED ORDER — VENLAFAXINE HCL ER 150 MG PO CP24: ORAL_CAPSULE | ORAL | Status: DC

## 2014-08-19 NOTE — Patient Instructions (Signed)
Stress and Stress Management Stress is a normal reaction to life events. It is what you feel when life demands more than you are used to or more than you can handle. Some stress can be useful. For example, the stress reaction can help you catch the last bus of the day, study for a test, or meet a deadline at work. But stress that occurs too often or for too long can cause problems. It can affect your emotional health and interfere with relationships and normal daily activities. Too much stress can weaken your immune system and increase your risk for physical illness. If you already have a medical problem, stress can make it worse. CAUSES  All sorts of life events may cause stress. An event that causes stress for one person may not be stressful for another person. Major life events commonly cause stress. These may be positive or negative. Examples include losing your job, moving into a new home, getting married, having a baby, or losing a loved one. Less obvious life events may also cause stress, especially if they occur day after day or in combination. Examples include working long hours, driving in traffic, caring for children, being in debt, or being in a difficult relationship. SIGNS AND SYMPTOMS Stress may cause emotional symptoms including, the following:  Anxiety. This is feeling worried, afraid, on edge, overwhelmed, or out of control.  Anger. This is feeling irritated or impatient.  Depression. This is feeling sad, down, helpless, or guilty.  Difficulty focusing, remembering, or making decisions. Stress may cause physical symptoms, including the following:   Aches and pains. These may affect your head, neck, back, stomach, or other areas of your body.  Tight muscles or clenched jaw.  Low energy or trouble sleeping. Stress may cause unhealthy behaviors, including the following:   Eating to feel better (overeating) or skipping meals.  Sleeping too little, too much, or both.  Working  too much or putting off tasks (procrastination).  Smoking, drinking alcohol, or using drugs to feel better. DIAGNOSIS  Stress is diagnosed through an assessment by your health care provider. Your health care provider will ask questions about your symptoms and any stressful life events.Your health care provider will also ask about your medical history and may order blood tests or other tests. Certain medical conditions and medicine can cause physical symptoms similar to stress. Mental illness can cause emotional symptoms and unhealthy behaviors similar to stress. Your health care provider may refer you to a mental health professional for further evaluation.  TREATMENT  Stress management is the recommended treatment for stress.The goals of stress management are reducing stressful life events and coping with stress in healthy ways.  Techniques for reducing stressful life events include the following:  Stress identification. Self-monitor for stress and identify what causes stress for you. These skills may help you to avoid some stressful events.  Time management. Set your priorities, keep a calendar of events, and learn to say "no." These tools can help you avoid making too many commitments. Techniques for coping with stress include the following:  Rethinking the problem. Try to think realistically about stressful events rather than ignoring them or overreacting. Try to find the positives in a stressful situation rather than focusing on the negatives.  Exercise. Physical exercise can release both physical and emotional tension. The key is to find a form of exercise you enjoy and do it regularly.  Relaxation techniques. These relax the body and mind. Examples include yoga, meditation, tai chi, biofeedback, deep  breathing, progressive muscle relaxation, listening to music, being out in nature, journaling, and other hobbies. Again, the key is to find one or more that you enjoy and can do  regularly.  Healthy lifestyle. Eat a balanced diet, get plenty of sleep, and do not smoke. Avoid using alcohol or drugs to relax.  Strong support network. Spend time with family, friends, or other people you enjoy being around.Express your feelings and talk things over with someone you trust. Counseling or talktherapy with a mental health professional may be helpful if you are having difficulty managing stress on your own. Medicine is typically not recommended for the treatment of stress.Talk to your health care provider if you think you need medicine for symptoms of stress. HOME CARE INSTRUCTIONS  Keep all follow-up visits as directed by your health care provider.  Take all medicines as directed by your health care provider. SEEK MEDICAL CARE IF:  Your symptoms get worse or you start having new symptoms.  You feel overwhelmed by your problems and can no longer manage them on your own. SEEK IMMEDIATE MEDICAL CARE IF:  You feel like hurting yourself or someone else. Document Released: 05/31/2001 Document Revised: 04/21/2014 Document Reviewed: 07/30/2013 ExitCare Patient Information 2015 [employer]. This information is not intended to replace advice given to you by your health care provider. Make sure you discuss any questions you have with your health care provider.  

## 2014-08-19 NOTE — Progress Notes (Signed)
   Subjective:    Patient ID: Brianna Sanchez, female    DOB: 1966-01-02, 48 y.o.   MRN: [id number]  HPI patient in c/o of worsening depression and anxiety- Her husband moved out Sunday and she is really stresses- not sure if he is having an affair or not. They have been having problems off and on for years. Patient has been dealing with anxiety for years. SHe is currently on venlafaxine and xanax.     Review of Systems  Constitutional: Negative.   HENT: Negative.   Respiratory: Negative.   Cardiovascular: Negative.   Genitourinary: Negative.   Neurological: Negative.   Psychiatric/Behavioral: Negative.   All other systems reviewed and are negative.      Objective:   Physical Exam  Constitutional: She is oriented to person, place, and time. She appears well-developed and well-nourished.  Cardiovascular: Normal rate, regular rhythm and normal heart sounds.   Pulmonary/Chest: Effort normal and breath sounds normal.  Neurological: She is alert and oriented to person, place, and time.  Skin: Skin is warm.  Psychiatric: She has a normal mood and affect. Her behavior is normal. Judgment and thought content normal.  Tearful throughout exam.    BP 120/84  Pulse 95  Temp(Src) 98 F (36.7 C) (Oral)  Ht 5\' 6"  (1.676 m)  Wt 198 lb 6.4 oz (89.994 kg)  BMI 32.04 kg/m2]      Assessment & Plan:   1. Depression   2. GAD (generalized anxiety disorder)    Meds ordered this encounter  Medications  . venlafaxine XR (EFFEXOR-XR) 150 MG 24 hr capsule    Sig: 2 po qd    Dispense:  60 capsule    Refill:  3    Order Specific Question:  Supervising Provider    Answer:  Chipper Herb [[id number]]  . ALPRAZolam (XANAX) 1 MG tablet    Sig: Take 1 tab po bid    Dispense:  60 tablet    Refill:  0    Do not fill till 09/27/14\    Order Specific Question:  Supervising Provider    Answer:  Chipper Herb [[id number]]   Increased effexor to 2 po qd Do  Not want to increase xanax at this time Need  to try to work through problems  [place], FNP

## 2014-09-26 ENCOUNTER — Encounter: Payer: Self-pay | Admitting: Nurse Practitioner

## 2014-09-26 ENCOUNTER — Ambulatory Visit (INDEPENDENT_AMBULATORY_CARE_PROVIDER_SITE_OTHER): Payer: Medicare Other | Admitting: Nurse Practitioner

## 2014-09-26 VITALS — BP 125/98 | HR 76 | Temp 97.5°F | Ht 66.0 in | Wt 201.0 lb

## 2014-09-26 DIAGNOSIS — F329 Major depressive disorder, single episode, unspecified: Secondary | ICD-10-CM | POA: Diagnosis not present

## 2014-09-26 DIAGNOSIS — E785 Hyperlipidemia, unspecified: Secondary | ICD-10-CM

## 2014-09-26 DIAGNOSIS — F32A Depression, unspecified: Secondary | ICD-10-CM

## 2014-09-26 DIAGNOSIS — F411 Generalized anxiety disorder: Secondary | ICD-10-CM

## 2014-09-26 MED ORDER — ATORVASTATIN CALCIUM 40 MG PO TABS: 40.0000 mg | ORAL_TABLET | Freq: Every day | ORAL | Status: DC

## 2014-09-26 MED ORDER — ALPRAZOLAM 1 MG PO TABS: ORAL_TABLET | ORAL | Status: DC

## 2014-09-26 NOTE — Progress Notes (Signed)
Subjective:    Patient ID: Brianna Sanchez, female    DOB: [birth year], 48 y.o.   MRN: [id number]  Patient here today for follow up of chronic medical problems. Doing well   Hyperlipidemia This is a chronic problem. The current episode started more than 1 year ago. The problem is uncontrolled. Recent lipid tests were reviewed and are high. Exacerbating diseases include obesity. She has no history of diabetes or hypothyroidism. There are no known factors aggravating her hyperlipidemia. Pertinent negatives include no focal sensory loss, leg pain, myalgias or shortness of breath. Current antihyperlipidemic treatment includes statins. The current treatment provides moderate improvement of lipids. Compliance problems include adherence to diet and adherence to exercise.  Risk factors for coronary artery disease include obesity and post-menopausal.  Anxiety Presents for follow-up visit. Symptoms include nervous/anxious behavior, palpitations and panic. Patient reports no shortness of breath or suicidal ideas. Symptoms occur most days. The symptoms are aggravated by family issues. The quality of sleep is fair. Nighttime awakenings: occasional.   Her past medical history is significant for depression. The treatment provided moderate relief. Compliance with prior treatments has been good.  Gad/Depression Currently on effexor ( was increased to 372m at last visit) and xanax- she is doing okay- still doesn't like getting out  Of house and doing things- She is some better since last visit.  * Review of Systems  Constitutional: Negative for fever, activity change, appetite change, fatigue and unexpected weight change.  HENT: Negative.   Eyes: Negative.   Respiratory: Negative.  Negative for shortness of breath.   Cardiovascular: Positive for palpitations.  Gastrointestinal: Negative.   Genitourinary: Negative.   Musculoskeletal: Negative.  Negative for myalgias.  Neurological: Negative.   Hematological:  Negative.   Psychiatric/Behavioral: Negative for suicidal ideas. The patient is nervous/anxious.        Objective:   Physical Exam  Constitutional: She is oriented to person, place, and time. She appears well-developed and well-nourished.  HENT:  Nose: Nose normal.  Mouth/Throat: Oropharynx is clear and moist.  Eyes: EOM are normal.  Neck: Trachea normal, normal range of motion and full passive range of motion without pain. Neck supple. No JVD present. Carotid bruit is not present. No thyromegaly present.  Cardiovascular: Normal rate, regular rhythm, normal heart sounds and intact distal pulses.  Exam reveals no gallop and no friction rub.   No murmur heard. Pulmonary/Chest: Effort normal and breath sounds normal.  Abdominal: Soft. Bowel sounds are normal. She exhibits no distension and no mass. There is no tenderness (mild right upper quadrant pain on palpation).  Musculoskeletal: Normal range of motion.  Lymphadenopathy:    She has no cervical adenopathy.  Neurological: She is alert and oriented to person, place, and time. She has normal reflexes.  Skin: Skin is warm and dry.  Psychiatric: She has a normal mood and affect. Her behavior is normal. Judgment and thought content normal.     BP 125/98  Pulse 76  Temp(Src) 97.5 F (36.4 C) (Oral)  Ht '5\' 6"'  (1.676 m)  Wt 201 lb (91.173 kg)  BMI 32.46 kg/m2      Assessment & Plan:   1. Hyperlipidemia  - atorvastatin (LIPITOR) 40 MG tablet; Take 1 tablet (40 mg total) by mouth daily.  Dispense: 30 tablet; Refill: 5 - CMP14+EGFR - NMR, lipoprofile  2. GAD (generalized anxiety disorder)  - ALPRAZolam (XANAX) 1 MG tablet; Take 1 tab po bid  Dispense: 60 tablet; Refill: 0  3. Depression  Stress management Labs  pending Health maintenance reviewed Diet and exercise encouraged Continue all meds Follow up  In 3 months   [place], FNP

## 2014-09-26 NOTE — Patient Instructions (Signed)
Stress and Stress Management Stress is a normal reaction to life events. It is what you feel when life demands more than you are used to or more than you can handle. Some stress can be useful. For example, the stress reaction can help you catch the last bus of the day, study for a test, or meet a deadline at work. But stress that occurs too often or for too long can cause problems. It can affect your emotional health and interfere with relationships and normal daily activities. Too much stress can weaken your immune system and increase your risk for physical illness. If you already have a medical problem, stress can make it worse. CAUSES  All sorts of life events may cause stress. An event that causes stress for one person may not be stressful for another person. Major life events commonly cause stress. These may be positive or negative. Examples include losing your job, moving into a new home, getting married, having a baby, or losing a loved one. Less obvious life events may also cause stress, especially if they occur day after day or in combination. Examples include working long hours, driving in traffic, caring for children, being in debt, or being in a difficult relationship. SIGNS AND SYMPTOMS Stress may cause emotional symptoms including, the following:  Anxiety. This is feeling worried, afraid, on edge, overwhelmed, or out of control.  Anger. This is feeling irritated or impatient.  Depression. This is feeling sad, down, helpless, or guilty.  Difficulty focusing, remembering, or making decisions. Stress may cause physical symptoms, including the following:   Aches and pains. These may affect your head, neck, back, stomach, or other areas of your body.  Tight muscles or clenched jaw.  Low energy or trouble sleeping. Stress may cause unhealthy behaviors, including the following:   Eating to feel better (overeating) or skipping meals.  Sleeping too little, too much, or both.  Working  too much or putting off tasks (procrastination).  Smoking, drinking alcohol, or using drugs to feel better. DIAGNOSIS  Stress is diagnosed through an assessment by your health care provider. Your health care provider will ask questions about your symptoms and any stressful life events.Your health care provider will also ask about your medical history and may order blood tests or other tests. Certain medical conditions and medicine can cause physical symptoms similar to stress. Mental illness can cause emotional symptoms and unhealthy behaviors similar to stress. Your health care provider may refer you to a mental health professional for further evaluation.  TREATMENT  Stress management is the recommended treatment for stress.The goals of stress management are reducing stressful life events and coping with stress in healthy ways.  Techniques for reducing stressful life events include the following:  Stress identification. Self-monitor for stress and identify what causes stress for you. These skills may help you to avoid some stressful events.  Time management. Set your priorities, keep a calendar of events, and learn to say "no." These tools can help you avoid making too many commitments. Techniques for coping with stress include the following:  Rethinking the problem. Try to think realistically about stressful events rather than ignoring them or overreacting. Try to find the positives in a stressful situation rather than focusing on the negatives.  Exercise. Physical exercise can release both physical and emotional tension. The key is to find a form of exercise you enjoy and do it regularly.  Relaxation techniques. These relax the body and mind. Examples include yoga, meditation, tai chi, biofeedback, deep  breathing, progressive muscle relaxation, listening to music, being out in nature, journaling, and other hobbies. Again, the key is to find one or more that you enjoy and can do  regularly.  Healthy lifestyle. Eat a balanced diet, get plenty of sleep, and do not smoke. Avoid using alcohol or drugs to relax.  Strong support network. Spend time with family, friends, or other people you enjoy being around.Express your feelings and talk things over with someone you trust. Counseling or talktherapy with a mental health professional may be helpful if you are having difficulty managing stress on your own. Medicine is typically not recommended for the treatment of stress.Talk to your health care provider if you think you need medicine for symptoms of stress. HOME CARE INSTRUCTIONS  Keep all follow-up visits as directed by your health care provider.  Take all medicines as directed by your health care provider. SEEK MEDICAL CARE IF:  Your symptoms get worse or you start having new symptoms.  You feel overwhelmed by your problems and can no longer manage them on your own. SEEK IMMEDIATE MEDICAL CARE IF:  You feel like hurting yourself or someone else. Document Released: 05/31/2001 Document Revised: 04/21/2014 Document Reviewed: 07/30/2013 ExitCare Patient Information 2015 [employer]. This information is not intended to replace advice given to you by your health care provider. Make sure you discuss any questions you have with your health care provider.  

## 2014-09-27 LAB — CMP14+EGFR
ALT: 17 IU/L (ref 0–32)
AST: 17 IU/L (ref 0–40)
Albumin/Globulin Ratio: 1.6 (ref 1.1–2.5)
Albumin: 4.6 g/dL (ref 3.5–5.5)
Alkaline Phosphatase: 111 IU/L (ref 39–117)
BILIRUBIN TOTAL: 0.3 mg/dL (ref 0.0–1.2)
BUN/Creatinine Ratio: 21 (ref 9–23)
BUN: 17 mg/dL (ref 6–24)
CALCIUM: 10.2 mg/dL (ref 8.7–10.2)
CO2: 23 mmol/L (ref 18–29)
CREATININE: 0.82 mg/dL (ref 0.57–1.00)
Chloride: 103 mmol/L (ref 97–108)
GFR, EST AFRICAN AMERICAN: 97 mL/min/{1.73_m2} (ref >59)
GFR, EST NON AFRICAN AMERICAN: 84 mL/min/{1.73_m2} (ref >59)
GLOBULIN, TOTAL: 2.9 g/dL (ref 1.5–4.5)
Glucose: 88 mg/dL (ref 65–99)
Potassium: 5.4 mmol/L — ABNORMAL HIGH (ref 3.5–5.2)
SODIUM: 146 mmol/L — AB (ref 134–144)
Total Protein: 7.5 g/dL (ref 6.0–8.5)

## 2014-09-27 LAB — NMR, LIPOPROFILE
Cholesterol: 146 mg/dL (ref 100–199)
HDL Cholesterol by NMR: 45 mg/dL (ref >39)
HDL Particle Number: 39.2 umol/L (ref >=30.5)
LDL Particle Number: 801 nmol/L (ref <1000)
LDL SIZE: 20.8 nm (ref >20.5)
LDLC SERPL CALC-MCNC: 71 mg/dL (ref 0–99)
LP-IR Score: 72 — ABNORMAL HIGH (ref <=45)
SMALL LDL PARTICLE NUMBER: 336 nmol/L (ref <=527)
TRIGLYCERIDES BY NMR: 150 mg/dL — AB (ref 0–149)

## 2014-09-30 ENCOUNTER — Other Ambulatory Visit (INDEPENDENT_AMBULATORY_CARE_PROVIDER_SITE_OTHER): Payer: Medicare Other

## 2014-09-30 DIAGNOSIS — E875 Hyperkalemia: Secondary | ICD-10-CM

## 2014-09-30 NOTE — Progress Notes (Signed)
Lab only 

## 2014-10-01 LAB — POTASSIUM: Potassium: 4.1 mmol/L (ref 3.5–5.2)

## 2014-11-19 ENCOUNTER — Other Ambulatory Visit: Payer: Self-pay | Admitting: *Deleted

## 2014-11-19 DIAGNOSIS — F411 Generalized anxiety disorder: Secondary | ICD-10-CM

## 2014-11-19 NOTE — Telephone Encounter (Signed)
Last ov 10/15. Last refill 10/23/14. Call to [facility] if approved [phone]. Route to nurse pool.

## 2014-11-20 MED ORDER — ALPRAZOLAM 1 MG PO TABS: ORAL_TABLET | ORAL | Status: DC

## 2014-11-20 NOTE — Telephone Encounter (Signed)
Please review

## 2014-11-21 ENCOUNTER — Other Ambulatory Visit: Payer: Self-pay | Admitting: Nurse Practitioner

## 2014-11-21 NOTE — Telephone Encounter (Signed)
Pt aware rx called into [facility].

## 2014-11-26 ENCOUNTER — Encounter: Payer: Self-pay | Admitting: *Deleted

## 2014-12-20 ENCOUNTER — Other Ambulatory Visit: Payer: Self-pay | Admitting: Family Medicine

## 2014-12-20 NOTE — Telephone Encounter (Signed)
Last filled 11/21/14, last seen 09/26/14. Call into [facility] [phone]

## 2014-12-21 NOTE — Telephone Encounter (Signed)
Please call in xanax with 1 refills 

## 2014-12-22 ENCOUNTER — Other Ambulatory Visit: Payer: Self-pay | Admitting: Nurse Practitioner

## 2014-12-22 NOTE — Telephone Encounter (Signed)
Script called to pharmacy

## 2014-12-22 NOTE — Telephone Encounter (Signed)
Was done 12/21/14

## 2014-12-22 NOTE — Telephone Encounter (Signed)
Xanax refills called in.

## 2014-12-31 ENCOUNTER — Ambulatory Visit: Payer: Medicare Other | Admitting: Nurse Practitioner

## 2015-01-01 ENCOUNTER — Ambulatory Visit: Payer: Medicare Other | Admitting: Nurse Practitioner

## 2015-01-13 ENCOUNTER — Ambulatory Visit (INDEPENDENT_AMBULATORY_CARE_PROVIDER_SITE_OTHER): Payer: Medicare Other | Admitting: Nurse Practitioner

## 2015-01-13 ENCOUNTER — Encounter: Payer: Self-pay | Admitting: Nurse Practitioner

## 2015-01-13 VITALS — BP 104/71 | HR 78 | Temp 97.0°F | Ht 66.0 in | Wt 206.0 lb

## 2015-01-13 DIAGNOSIS — F411 Generalized anxiety disorder: Secondary | ICD-10-CM | POA: Diagnosis not present

## 2015-01-13 DIAGNOSIS — F32A Depression, unspecified: Secondary | ICD-10-CM

## 2015-01-13 DIAGNOSIS — F329 Major depressive disorder, single episode, unspecified: Secondary | ICD-10-CM | POA: Diagnosis not present

## 2015-01-13 DIAGNOSIS — E785 Hyperlipidemia, unspecified: Secondary | ICD-10-CM

## 2015-01-13 MED ORDER — VENLAFAXINE HCL ER 150 MG PO CP24: ORAL_CAPSULE | ORAL | Status: DC

## 2015-01-13 NOTE — Patient Instructions (Signed)
Stress and Stress Management Stress is a normal reaction to life events. It is what you feel when life demands more than you are used to or more than you can handle. Some stress can be useful. For example, the stress reaction can help you catch the last bus of the day, study for a test, or meet a deadline at work. But stress that occurs too often or for too long can cause problems. It can affect your emotional health and interfere with relationships and normal daily activities. Too much stress can weaken your immune system and increase your risk for physical illness. If you already have a medical problem, stress can make it worse. CAUSES  All sorts of life events may cause stress. An event that causes stress for one person may not be stressful for another person. Major life events commonly cause stress. These may be positive or negative. Examples include losing your job, moving into a new home, getting married, having a baby, or losing a loved one. Less obvious life events may also cause stress, especially if they occur day after day or in combination. Examples include working long hours, driving in traffic, caring for children, being in debt, or being in a difficult relationship. SIGNS AND SYMPTOMS Stress may cause emotional symptoms including, the following:  Anxiety. This is feeling worried, afraid, on edge, overwhelmed, or out of control.  Anger. This is feeling irritated or impatient.  Depression. This is feeling sad, down, helpless, or guilty.  Difficulty focusing, remembering, or making decisions. Stress may cause physical symptoms, including the following:   Aches and pains. These may affect your head, neck, back, stomach, or other areas of your body.  Tight muscles or clenched jaw.  Low energy or trouble sleeping. Stress may cause unhealthy behaviors, including the following:   Eating to feel better (overeating) or skipping meals.  Sleeping too little, too much, or both.  Working  too much or putting off tasks (procrastination).  Smoking, drinking alcohol, or using drugs to feel better. DIAGNOSIS  Stress is diagnosed through an assessment by your health care provider. Your health care provider will ask questions about your symptoms and any stressful life events.Your health care provider will also ask about your medical history and may order blood tests or other tests. Certain medical conditions and medicine can cause physical symptoms similar to stress. Mental illness can cause emotional symptoms and unhealthy behaviors similar to stress. Your health care provider may refer you to a mental health professional for further evaluation.  TREATMENT  Stress management is the recommended treatment for stress.The goals of stress management are reducing stressful life events and coping with stress in healthy ways.  Techniques for reducing stressful life events include the following:  Stress identification. Self-monitor for stress and identify what causes stress for you. These skills may help you to avoid some stressful events.  Time management. Set your priorities, keep a calendar of events, and learn to say "no." These tools can help you avoid making too many commitments. Techniques for coping with stress include the following:  Rethinking the problem. Try to think realistically about stressful events rather than ignoring them or overreacting. Try to find the positives in a stressful situation rather than focusing on the negatives.  Exercise. Physical exercise can release both physical and emotional tension. The key is to find a form of exercise you enjoy and do it regularly.  Relaxation techniques. These relax the body and mind. Examples include yoga, meditation, tai chi, biofeedback, deep  breathing, progressive muscle relaxation, listening to music, being out in nature, journaling, and other hobbies. Again, the key is to find one or more that you enjoy and can do  regularly.  Healthy lifestyle. Eat a balanced diet, get plenty of sleep, and do not smoke. Avoid using alcohol or drugs to relax.  Strong support network. Spend time with family, friends, or other people you enjoy being around.Express your feelings and talk things over with someone you trust. Counseling or talktherapy with a mental health professional may be helpful if you are having difficulty managing stress on your own. Medicine is typically not recommended for the treatment of stress.Talk to your health care provider if you think you need medicine for symptoms of stress. HOME CARE INSTRUCTIONS  Keep all follow-up visits as directed by your health care provider.  Take all medicines as directed by your health care provider. SEEK MEDICAL CARE IF:  Your symptoms get worse or you start having new symptoms.  You feel overwhelmed by your problems and can no longer manage them on your own. SEEK IMMEDIATE MEDICAL CARE IF:  You feel like hurting yourself or someone else. Document Released: 05/31/2001 Document Revised: 04/21/2014 Document Reviewed: 07/30/2013 ExitCare Patient Information 2015 [employer]. This information is not intended to replace advice given to you by your health care provider. Make sure you discuss any questions you have with your health care provider.  

## 2015-01-13 NOTE — Progress Notes (Signed)
  Subjective:    Patient ID: Brianna Sanchez, female    DOB: February 17, 1966, 49 y.o.   MRN: [id number]  Patient here today for follow up of chronic medical problems.    Hyperlipidemia This is a chronic problem. The current episode started more than 1 year ago. The problem is controlled. Recent lipid tests were reviewed and are normal. Exacerbating diseases include obesity. She has no history of diabetes or hypothyroidism. There are no known factors aggravating her hyperlipidemia. Pertinent negatives include no myalgias. Current antihyperlipidemic treatment includes statins. The current treatment provides moderate improvement of lipids. Compliance problems include adherence to exercise and adherence to diet.  Risk factors for coronary artery disease include dyslipidemia and obesity.  Gad/Depression Currently on effexor ( was increased to $RemoveBefo'300mg'kSvgAVWROlO$  at last visit) and xanax- she is doing okay- still doesn't like getting out  Of house and doing things- She is some better since last visit. She said that her husband and her are still separated and she has bad days but she does not want to change her meds at this time.  * Review of Systems  Constitutional: Negative for fever, activity change, appetite change, fatigue and unexpected weight change.  HENT: Negative.   Eyes: Negative.   Respiratory: Negative.   Gastrointestinal: Negative.   Genitourinary: Negative.   Musculoskeletal: Negative.  Negative for myalgias.  Neurological: Negative.   Hematological: Negative.        Objective:   Physical Exam  Constitutional: She is oriented to person, place, and time. She appears well-developed and well-nourished.  HENT:  Nose: Nose normal.  Mouth/Throat: Oropharynx is clear and moist.  Eyes: EOM are normal.  Neck: Trachea normal, normal range of motion and full passive range of motion without pain. Neck supple. No JVD present. Carotid bruit is not present. No thyromegaly present.  Cardiovascular: Normal rate,  regular rhythm, normal heart sounds and intact distal pulses.  Exam reveals no gallop and no friction rub.   No murmur heard. Pulmonary/Chest: Effort normal and breath sounds normal.  Abdominal: Soft. Bowel sounds are normal. She exhibits no distension and no mass. There is no tenderness (mild right upper quadrant pain on palpation).  Musculoskeletal: Normal range of motion.  Lymphadenopathy:    She has no cervical adenopathy.  Neurological: She is alert and oriented to person, place, and time. She has normal reflexes.  Skin: Skin is warm and dry.  Psychiatric: She has a normal mood and affect. Her behavior is normal. Judgment and thought content normal.    BP 104/71 mmHg  Pulse 78  Temp(Src) 97 F (36.1 C) (Oral)  Ht $R'5\' 6"'Sx$  (1.676 m)  Wt 206 lb (93.441 kg)  BMI 33.27 kg/m2         Assessment & Plan:    1. Hyperlipidemia Low fat idet and exercise - CMP14+EGFR - NMR, lipoprofile  2. GAD (generalized anxiety disorder) Stress manangement  3. Depression - venlafaxine XR (EFFEXOR-XR) 150 MG 24 hr capsule; TAKE (2) CAPSULES ONCE DAILY.  Dispense: 60 capsule; Refill: 5    Labs pending Health maintenance reviewed Diet and exercise encouraged Continue all meds Follow up  In 3 months   [place], FNP

## 2015-01-14 LAB — NMR, LIPOPROFILE
CHOLESTEROL: 149 mg/dL (ref 100–199)
HDL Cholesterol by NMR: 46 mg/dL (ref >39)
HDL PARTICLE NUMBER: 34.5 umol/L (ref >=30.5)
LDL Particle Number: 858 nmol/L (ref <1000)
LDL Size: 20.4 nm (ref >20.5)
LDL-C: 77 mg/dL (ref 0–99)
LP-IR Score: 75 — ABNORMAL HIGH (ref <=45)
SMALL LDL PARTICLE NUMBER: 523 nmol/L (ref <=527)
Triglycerides by NMR: 128 mg/dL (ref 0–149)

## 2015-01-14 LAB — CMP14+EGFR
ALK PHOS: 111 IU/L (ref 39–117)
ALT: 42 IU/L — ABNORMAL HIGH (ref 0–32)
AST: 24 IU/L (ref 0–40)
Albumin/Globulin Ratio: 1.7 (ref 1.1–2.5)
Albumin: 4.5 g/dL (ref 3.5–5.5)
BILIRUBIN TOTAL: 0.4 mg/dL (ref 0.0–1.2)
BUN/Creatinine Ratio: 21 (ref 9–23)
BUN: 15 mg/dL (ref 6–24)
CALCIUM: 9.6 mg/dL (ref 8.7–10.2)
CO2: 23 mmol/L (ref 18–29)
Chloride: 101 mmol/L (ref 97–108)
Creatinine, Ser: 0.72 mg/dL (ref 0.57–1.00)
GFR, EST AFRICAN AMERICAN: 114 mL/min/{1.73_m2} (ref >59)
GFR, EST NON AFRICAN AMERICAN: 99 mL/min/{1.73_m2} (ref >59)
GLOBULIN, TOTAL: 2.7 g/dL (ref 1.5–4.5)
Glucose: 106 mg/dL — ABNORMAL HIGH (ref 65–99)
POTASSIUM: 4.8 mmol/L (ref 3.5–5.2)
SODIUM: 140 mmol/L (ref 134–144)
Total Protein: 7.2 g/dL (ref 6.0–8.5)

## 2015-02-18 ENCOUNTER — Telehealth: Payer: Self-pay | Admitting: Nurse Practitioner

## 2015-02-18 MED ORDER — ALPRAZOLAM 1 MG PO TABS: ORAL_TABLET | ORAL | Status: DC

## 2015-02-18 NOTE — Telephone Encounter (Signed)
Med phoned to pharm Pt aware

## 2015-02-18 NOTE — Telephone Encounter (Signed)
Please review and advise.

## 2015-04-21 ENCOUNTER — Other Ambulatory Visit: Payer: Self-pay

## 2015-04-21 DIAGNOSIS — E785 Hyperlipidemia, unspecified: Secondary | ICD-10-CM

## 2015-04-21 MED ORDER — ATORVASTATIN CALCIUM 40 MG PO TABS: 40.0000 mg | ORAL_TABLET | Freq: Every day | ORAL | Status: DC

## 2015-04-21 NOTE — Telephone Encounter (Signed)
Last seen 01/13/15 MMM if approved route to nurse to call into [facility]  [phone]

## 2015-04-23 ENCOUNTER — Ambulatory Visit (INDEPENDENT_AMBULATORY_CARE_PROVIDER_SITE_OTHER): Payer: Medicare Other | Admitting: Nurse Practitioner

## 2015-04-23 ENCOUNTER — Ambulatory Visit (INDEPENDENT_AMBULATORY_CARE_PROVIDER_SITE_OTHER): Payer: Medicare Other

## 2015-04-23 ENCOUNTER — Encounter (INDEPENDENT_AMBULATORY_CARE_PROVIDER_SITE_OTHER): Payer: Self-pay

## 2015-04-23 ENCOUNTER — Encounter: Payer: Self-pay | Admitting: Internal Medicine

## 2015-04-23 ENCOUNTER — Encounter: Payer: Self-pay | Admitting: Nurse Practitioner

## 2015-04-23 VITALS — BP 123/79 | HR 80 | Temp 97.4°F | Ht 66.0 in | Wt 205.0 lb

## 2015-04-23 DIAGNOSIS — Z780 Asymptomatic menopausal state: Secondary | ICD-10-CM

## 2015-04-23 DIAGNOSIS — Z1211 Encounter for screening for malignant neoplasm of colon: Secondary | ICD-10-CM

## 2015-04-23 DIAGNOSIS — F411 Generalized anxiety disorder: Secondary | ICD-10-CM | POA: Diagnosis not present

## 2015-04-23 DIAGNOSIS — E785 Hyperlipidemia, unspecified: Secondary | ICD-10-CM | POA: Diagnosis not present

## 2015-04-23 DIAGNOSIS — F32A Depression, unspecified: Secondary | ICD-10-CM

## 2015-04-23 DIAGNOSIS — Z0000 Encounter for general adult medical examination without abnormal findings: Secondary | ICD-10-CM

## 2015-04-23 DIAGNOSIS — F329 Major depressive disorder, single episode, unspecified: Secondary | ICD-10-CM

## 2015-04-23 DIAGNOSIS — Z13820 Encounter for screening for osteoporosis: Secondary | ICD-10-CM

## 2015-04-23 MED ORDER — ATORVASTATIN CALCIUM 40 MG PO TABS: 40.0000 mg | ORAL_TABLET | Freq: Every day | ORAL | Status: DC

## 2015-04-23 MED ORDER — ALPRAZOLAM 1 MG PO TABS
ORAL_TABLET | ORAL | Status: DC
Start: 2015-04-23 — End: 2015-06-23

## 2015-04-23 MED ORDER — VENLAFAXINE HCL ER 150 MG PO CP24
ORAL_CAPSULE | ORAL | Status: DC
Start: 2015-04-23 — End: 2015-11-20

## 2015-04-23 NOTE — Progress Notes (Signed)
Subjective:    Patient ID: Brianna Sanchez, female    DOB: 04-24-1966, 49 y.o.   MRN: [id number]  Patient here today for follow up of chronic medical problems and annual physical exam with NO PAP.  She has no complaints today.   Hyperlipidemia This is a chronic problem. The current episode started more than 1 year ago. The problem is controlled. Recent lipid tests were reviewed and are normal. Exacerbating diseases include obesity. She has no history of diabetes or hypothyroidism. There are no known factors aggravating her hyperlipidemia. Pertinent negatives include no myalgias. Current antihyperlipidemic treatment includes statins. The current treatment provides moderate improvement of lipids. Compliance problems include adherence to exercise and adherence to diet.  Risk factors for coronary artery disease include dyslipidemia and obesity.  Gad/Depression Currently on effexor ( was increased to 338m at last visit) and xanax- she is doing okay- still doesn't like getting out  Of house and doing things- She is some better since last visit. She said that her husband and her are still separated and she has bad days but she does not want to change her meds at this time.  * Review of Systems  Constitutional: Negative for fever, activity change, appetite change, fatigue and unexpected weight change.  HENT: Negative.   Eyes: Negative.   Respiratory: Negative.   Gastrointestinal: Negative.   Genitourinary: Negative.   Musculoskeletal: Negative.  Negative for myalgias.  Neurological: Negative.   Hematological: Negative.        Objective:   Physical Exam  Constitutional: She is oriented to person, place, and time. She appears well-developed and well-nourished.  HENT:  Nose: Nose normal.  Mouth/Throat: Oropharynx is clear and moist.  Eyes: EOM are normal.  Neck: Trachea normal, normal range of motion and full passive range of motion without pain. Neck supple. No JVD present. Carotid bruit is  not present. No thyromegaly present.  Cardiovascular: Normal rate, regular rhythm, normal heart sounds and intact distal pulses.  Exam reveals no gallop and no friction rub.   No murmur heard. Pulmonary/Chest: Effort normal and breath sounds normal.  Abdominal: Soft. Bowel sounds are normal. She exhibits no distension and no mass. There is no tenderness (mild right upper quadrant pain on palpation).  Musculoskeletal: Normal range of motion.  Lymphadenopathy:    She has no cervical adenopathy.  Neurological: She is alert and oriented to person, place, and time. She has normal reflexes.  Skin: Skin is warm and dry.  Psychiatric: She has a normal mood and affect. Her behavior is normal. Judgment and thought content normal.    BP 123/79 mmHg  Pulse 80  Temp(Src) 97.4 F (36.3 C) (Oral)  Ht _0  (1.676 m)  Wt 205 lb (92.987 kg)  BMI 33.10 kg/m2   EAdella Nissen FNP Chest x ray - normal-Preliminary [facility] Coll[facility] F[facility]  [facility]      Assessment & Plan:   1. Annual physical exam - DG Chest 2 View; Future - EKG 12-Lead  2. Depression Stress management - venlafaxine XR (EFFEXOR-XR) 150 MG 24 hr capsule; TAKE (2) CAPSULES ONCE DAILY.  Dispense: 60 capsule; Refill: 5  3. Hyperlipidemia Low fta diet - CMP14+EGFR - NMR, lipoprofile - atorvastatin (LIPITOR) 40 MG tablet; Take 1 tablet (40 mg total) by mouth daily.  Dispense: 30 tablet; Refill: 1  4. Encounter for screening colonoscopy - Ambulatory referral to Gastroenterology  5. GAD (generalized anxiety disorder) - ALPRAZolam (XANAX) 1 MG tablet; TAKE ONE TABLET TWICE A DAY.  Dispense: 60 tablet; Refill: 1  6. Screening for osteoporosis dexa done today  7. Asymptomatic menopausal state - DG Bone Density; Future    Labs pending Health maintenance reviewed Diet and exercise encouraged Continue all meds Follow up  In 6 months   [place], FNP

## 2015-04-23 NOTE — Patient Instructions (Signed)
Stress and Stress Management Stress is a normal reaction to life events. It is what you feel when life demands more than you are used to or more than you can handle. Some stress can be useful. For example, the stress reaction can help you catch the last bus of the day, study for a test, or meet a deadline at work. But stress that occurs too often or for too long can cause problems. It can affect your emotional health and interfere with relationships and normal daily activities. Too much stress can weaken your immune system and increase your risk for physical illness. If you already have a medical problem, stress can make it worse. CAUSES  All sorts of life events may cause stress. An event that causes stress for one person may not be stressful for another person. Major life events commonly cause stress. These may be positive or negative. Examples include losing your job, moving into a new home, getting married, having a baby, or losing a loved one. Less obvious life events may also cause stress, especially if they occur day after day or in combination. Examples include working long hours, driving in traffic, caring for children, being in debt, or being in a difficult relationship. SIGNS AND SYMPTOMS Stress may cause emotional symptoms including, the following:  Anxiety. This is feeling worried, afraid, on edge, overwhelmed, or out of control.  Anger. This is feeling irritated or impatient.  Depression. This is feeling sad, down, helpless, or guilty.  Difficulty focusing, remembering, or making decisions. Stress may cause physical symptoms, including the following:   Aches and pains. These may affect your head, neck, back, stomach, or other areas of your body.  Tight muscles or clenched jaw.  Low energy or trouble sleeping. Stress may cause unhealthy behaviors, including the following:   Eating to feel better (overeating) or skipping meals.  Sleeping too little, too much, or both.  Working  too much or putting off tasks (procrastination).  Smoking, drinking alcohol, or using drugs to feel better. DIAGNOSIS  Stress is diagnosed through an assessment by your health care provider. Your health care provider will ask questions about your symptoms and any stressful life events.Your health care provider will also ask about your medical history and may order blood tests or other tests. Certain medical conditions and medicine can cause physical symptoms similar to stress. Mental illness can cause emotional symptoms and unhealthy behaviors similar to stress. Your health care provider may refer you to a mental health professional for further evaluation.  TREATMENT  Stress management is the recommended treatment for stress.The goals of stress management are reducing stressful life events and coping with stress in healthy ways.  Techniques for reducing stressful life events include the following:  Stress identification. Self-monitor for stress and identify what causes stress for you. These skills may help you to avoid some stressful events.  Time management. Set your priorities, keep a calendar of events, and learn to say "no." These tools can help you avoid making too many commitments. Techniques for coping with stress include the following:  Rethinking the problem. Try to think realistically about stressful events rather than ignoring them or overreacting. Try to find the positives in a stressful situation rather than focusing on the negatives.  Exercise. Physical exercise can release both physical and emotional tension. The key is to find a form of exercise you enjoy and do it regularly.  Relaxation techniques. These relax the body and mind. Examples include yoga, meditation, tai chi, biofeedback, deep  breathing, progressive muscle relaxation, listening to music, being out in nature, journaling, and other hobbies. Again, the key is to find one or more that you enjoy and can do  regularly.  Healthy lifestyle. Eat a balanced diet, get plenty of sleep, and do not smoke. Avoid using alcohol or drugs to relax.  Strong support network. Spend time with family, friends, or other people you enjoy being around.Express your feelings and talk things over with someone you trust. Counseling or talktherapy with a mental health professional may be helpful if you are having difficulty managing stress on your own. Medicine is typically not recommended for the treatment of stress.Talk to your health care provider if you think you need medicine for symptoms of stress. HOME CARE INSTRUCTIONS  Keep all follow-up visits as directed by your health care provider.  Take all medicines as directed by your health care provider. SEEK MEDICAL CARE IF:  Your symptoms get worse or you start having new symptoms.  You feel overwhelmed by your problems and can no longer manage them on your own. SEEK IMMEDIATE MEDICAL CARE IF:  You feel like hurting yourself or someone else. Document Released: 05/31/2001 Document Revised: 04/21/2014 Document Reviewed: 07/30/2013 ExitCare Patient Information 2015 [employer]. This information is not intended to replace advice given to you by your health care provider. Make sure you discuss any questions you have with your health care provider.  

## 2015-04-24 LAB — NMR, LIPOPROFILE
CHOLESTEROL: 128 mg/dL (ref 100–199)
HDL Cholesterol by NMR: 49 mg/dL (ref >39)
HDL Particle Number: 37.6 umol/L (ref >=30.5)
LDL Particle Number: 881 nmol/L (ref <1000)
LDL SIZE: 20.7 nm (ref >20.5)
LDL-C: 62 mg/dL (ref 0–99)
LP-IR Score: 67 — ABNORMAL HIGH (ref <=45)
Small LDL Particle Number: 433 nmol/L (ref <=527)
TRIGLYCERIDES BY NMR: 84 mg/dL (ref 0–149)

## 2015-04-24 LAB — CMP14+EGFR
ALBUMIN: 4.4 g/dL (ref 3.5–5.5)
ALT: 21 IU/L (ref 0–32)
AST: 15 IU/L (ref 0–40)
Albumin/Globulin Ratio: 1.6 (ref 1.1–2.5)
Alkaline Phosphatase: 99 IU/L (ref 39–117)
BILIRUBIN TOTAL: 0.3 mg/dL (ref 0.0–1.2)
BUN/Creatinine Ratio: 16 (ref 9–23)
BUN: 12 mg/dL (ref 6–24)
CO2: 22 mmol/L (ref 18–29)
Calcium: 9.6 mg/dL (ref 8.7–10.2)
Chloride: 100 mmol/L (ref 97–108)
Creatinine, Ser: 0.77 mg/dL (ref 0.57–1.00)
GFR calc Af Amer: 104 mL/min/{1.73_m2} (ref >59)
GFR calc non Af Amer: 90 mL/min/{1.73_m2} (ref >59)
GLUCOSE: 102 mg/dL — AB (ref 65–99)
Globulin, Total: 2.7 g/dL (ref 1.5–4.5)
POTASSIUM: 4.8 mmol/L (ref 3.5–5.2)
Sodium: 139 mmol/L (ref 134–144)
Total Protein: 7.1 g/dL (ref 6.0–8.5)

## 2015-06-23 ENCOUNTER — Other Ambulatory Visit: Payer: Self-pay

## 2015-06-23 DIAGNOSIS — F411 Generalized anxiety disorder: Secondary | ICD-10-CM

## 2015-06-23 MED ORDER — TRIAMCINOLONE 0.1 % CREAM:EUCERIN CREAM 1:1: 1.0000 "application " | TOPICAL_CREAM | Freq: Two times a day (BID) | CUTANEOUS | Status: DC

## 2015-06-23 MED ORDER — ALPRAZOLAM 1 MG PO TABS: ORAL_TABLET | ORAL | Status: DC

## 2015-06-23 NOTE — Telephone Encounter (Signed)
Called to [facility]

## 2015-06-23 NOTE — Telephone Encounter (Signed)
Please call in xanax with 1 refills 

## 2015-06-23 NOTE — Telephone Encounter (Signed)
Last seen 04/23/15 MMM If approved route to nurse to call into [facility]  [phone]

## 2015-07-03 ENCOUNTER — Encounter: Payer: Self-pay | Admitting: Internal Medicine

## 2015-07-07 DIAGNOSIS — Z1211 Encounter for screening for malignant neoplasm of colon: Secondary | ICD-10-CM | POA: Diagnosis not present

## 2015-07-07 DIAGNOSIS — D123 Benign neoplasm of transverse colon: Secondary | ICD-10-CM | POA: Diagnosis not present

## 2015-08-21 ENCOUNTER — Other Ambulatory Visit: Payer: Self-pay | Admitting: Nurse Practitioner

## 2015-08-21 ENCOUNTER — Telehealth: Payer: Self-pay | Admitting: Nurse Practitioner

## 2015-08-21 NOTE — Telephone Encounter (Signed)
Last filled 07/22/15, last seen 04/23/15. Call in at [facility] [phone]

## 2015-08-21 NOTE — Telephone Encounter (Signed)
Done, sent to [facility]

## 2015-08-21 NOTE — Telephone Encounter (Signed)
Please call in alprazolam with 1 refills 

## 2015-09-21 ENCOUNTER — Other Ambulatory Visit: Payer: Self-pay | Admitting: Nurse Practitioner

## 2015-09-21 NOTE — Telephone Encounter (Signed)
Last seen 04/23/15  MMM If approved route to nurse to call into

## 2015-09-21 NOTE — Telephone Encounter (Signed)
Last seen 04/23/15  Brianna Sanchez  If approved route to nurse to call into [facility]  [phone]

## 2015-09-21 NOTE — Telephone Encounter (Signed)
Refill called to [facility] 

## 2015-10-01 ENCOUNTER — Ambulatory Visit: Payer: Medicare Other | Admitting: Pediatrics

## 2015-10-21 ENCOUNTER — Other Ambulatory Visit: Payer: Self-pay | Admitting: Nurse Practitioner

## 2015-10-21 NOTE — Telephone Encounter (Signed)
Last filled 09/21/15, last seen 04/23/15. Call in at [facility]

## 2015-10-22 NOTE — Telephone Encounter (Signed)
Please call in alpraolam with 0 refills Last refill without being seen

## 2015-11-04 ENCOUNTER — Ambulatory Visit: Payer: Medicare Other | Admitting: Pediatrics

## 2015-11-20 ENCOUNTER — Other Ambulatory Visit: Payer: Self-pay | Admitting: *Deleted

## 2015-11-20 DIAGNOSIS — F32A Depression, unspecified: Secondary | ICD-10-CM

## 2015-11-20 DIAGNOSIS — F329 Major depressive disorder, single episode, unspecified: Secondary | ICD-10-CM

## 2015-11-20 MED ORDER — VENLAFAXINE HCL ER 150 MG PO CP24: ORAL_CAPSULE | ORAL | Status: DC

## 2015-11-20 MED ORDER — ATORVASTATIN CALCIUM 40 MG PO TABS: 40.0000 mg | ORAL_TABLET | Freq: Every day | ORAL | Status: DC

## 2015-11-20 MED ORDER — ALPRAZOLAM 1 MG PO TABS: ORAL_TABLET | ORAL | Status: DC

## 2015-11-20 NOTE — Telephone Encounter (Signed)
Last filled 10/22/15, last seen 04/23/15. Call into [facility] [phone]

## 2015-11-20 NOTE — Telephone Encounter (Signed)
Refill called to [facility]. Pt aware NTBS. Appt made for 2 wks from now

## 2015-11-20 NOTE — Telephone Encounter (Signed)
Last refill without being seen Please call in xanax with 0 refills 

## 2015-12-04 ENCOUNTER — Ambulatory Visit (INDEPENDENT_AMBULATORY_CARE_PROVIDER_SITE_OTHER): Payer: Medicare Other | Admitting: Nurse Practitioner

## 2015-12-04 ENCOUNTER — Encounter: Payer: Self-pay | Admitting: Nurse Practitioner

## 2015-12-04 VITALS — BP 115/80 | HR 72 | Temp 97.1°F | Ht 66.0 in | Wt 200.0 lb

## 2015-12-04 DIAGNOSIS — F411 Generalized anxiety disorder: Secondary | ICD-10-CM | POA: Diagnosis not present

## 2015-12-04 DIAGNOSIS — F32A Depression, unspecified: Secondary | ICD-10-CM

## 2015-12-04 DIAGNOSIS — E785 Hyperlipidemia, unspecified: Secondary | ICD-10-CM

## 2015-12-04 DIAGNOSIS — F329 Major depressive disorder, single episode, unspecified: Secondary | ICD-10-CM

## 2015-12-04 DIAGNOSIS — Z6832 Body mass index (BMI) 32.0-32.9, adult: Secondary | ICD-10-CM | POA: Diagnosis not present

## 2015-12-04 MED ORDER — ATORVASTATIN CALCIUM 40 MG PO TABS: 40.0000 mg | ORAL_TABLET | Freq: Every day | ORAL | Status: DC

## 2015-12-04 MED ORDER — ALPRAZOLAM 1 MG PO TABS: ORAL_TABLET | ORAL | Status: DC

## 2015-12-04 MED ORDER — VENLAFAXINE HCL ER 150 MG PO CP24: ORAL_CAPSULE | ORAL | Status: DC

## 2015-12-04 NOTE — Progress Notes (Signed)
Subjective:    Patient ID: Brianna Sanchez, female    DOB: [birth year], 49 y.o.   MRN: [id number]  Patient here today for follow up of chronic medical problems.    Hyperlipidemia This is a chronic problem. The current episode started more than 1 year ago. The problem is controlled. Recent lipid tests were reviewed and are normal. Exacerbating diseases include obesity. She has no history of diabetes or hypothyroidism. There are no known factors aggravating her hyperlipidemia. Pertinent negatives include no myalgias. Current antihyperlipidemic treatment includes statins. The current treatment provides moderate improvement of lipids. Compliance problems include adherence to exercise and adherence to diet.  Risk factors for coronary artery disease include dyslipidemia and obesity.  Gad/Depression Currently on effexor ( was increased to 364m at last visit) and xanax- she is doing okay- still doesn't like getting out  Of house and doing things- She is some better since last visit. She said that her husband and her are still separated and she has bad days but she does not want to change her meds at this time.  * Review of Systems  Constitutional: Negative for fever, activity change, appetite change, fatigue and unexpected weight change.  HENT: Negative.   Eyes: Negative.   Respiratory: Negative.   Gastrointestinal: Negative.   Genitourinary: Negative.   Musculoskeletal: Negative.  Negative for myalgias.  Neurological: Negative.   Hematological: Negative.        Objective:   Physical Exam  Constitutional: She is oriented to person, place, and time. She appears well-developed and well-nourished.  HENT:  Nose: Nose normal.  Mouth/Throat: Oropharynx is clear and moist.  Eyes: EOM are normal.  Neck: Trachea normal, normal range of motion and full passive range of motion without pain. Neck supple. No JVD present. Carotid bruit is not present. No thyromegaly present.  Cardiovascular: Normal rate,  regular rhythm, normal heart sounds and intact distal pulses.  Exam reveals no gallop and no friction rub.   No murmur heard. Pulmonary/Chest: Effort normal and breath sounds normal.  Abdominal: Soft. Bowel sounds are normal. She exhibits no distension and no mass. There is no tenderness (mild right upper quadrant pain on palpation).  Musculoskeletal: Normal range of motion.  Lymphadenopathy:    She has no cervical adenopathy.  Neurological: She is alert and oriented to person, place, and time. She has normal reflexes.  Skin: Skin is warm and dry.  Psychiatric: She has a normal mood and affect. Her behavior is normal. Judgment and thought content normal.    BP 115/80 mmHg  Pulse 72  Temp(Src) 97.1 F (36.2 C) (Oral)  Ht 5' 6" (1.676 m)  Wt 200 lb (90.719 kg)  BMI 32.30 kg/m2         Assessment & Plan:  1. Hyperlipidemia Low fat diet - atorvastatin (LIPITOR) 40 MG tablet; Take 1 tablet (40 mg total) by mouth daily.  Dispense: 30 tablet; Refill: 0 - CMP14+EGFR - Lipid panel  2. Depression Stress management - venlafaxine XR (EFFEXOR-XR) 150 MG 24 hr capsule; TAKE (2) CAPSULES ONCE DAILY.  Dispense: 60 capsule; Refill: 3  3. GAD (generalized anxiety disorder) Again stress management - ALPRAZolam (XANAX) 1 MG tablet; TAKE ONE TABLET TWICE A DAY.  Dispense: 60 tablet; Refill: 0  4. BMI 32.0-32.9,adult Discussed diet and exercise for person with BMI >25 Will recheck weight in 3-6 months     Labs pending Health maintenance reviewed Diet and exercise encouraged Continue all meds Follow up  In 3 month   Mary-Margaret  Hassell Done, [place]

## 2015-12-04 NOTE — Patient Instructions (Signed)
Stress and Stress Management Stress is a normal reaction to life events. It is what you feel when life demands more than you are used to or more than you can handle. Some stress can be useful. For example, the stress reaction can help you catch the last bus of the day, study for a test, or meet a deadline at work. But stress that occurs too often or for too long can cause problems. It can affect your emotional health and interfere with relationships and normal daily activities. Too much stress can weaken your immune system and increase your risk for physical illness. If you already have a medical problem, stress can make it worse. CAUSES  All sorts of life events may cause stress. An event that causes stress for one person may not be stressful for another person. Major life events commonly cause stress. These may be positive or negative. Examples include losing your job, moving into a new home, getting married, having a baby, or losing a loved one. Less obvious life events may also cause stress, especially if they occur day after day or in combination. Examples include working long hours, driving in traffic, caring for children, being in debt, or being in a difficult relationship. SIGNS AND SYMPTOMS Stress may cause emotional symptoms including, the following:  Anxiety. This is feeling worried, afraid, on edge, overwhelmed, or out of control.  Anger. This is feeling irritated or impatient.  Depression. This is feeling sad, down, helpless, or guilty.  Difficulty focusing, remembering, or making decisions. Stress may cause physical symptoms, including the following:   Aches and pains. These may affect your head, neck, back, stomach, or other areas of your body.  Tight muscles or clenched jaw.  Low energy or trouble sleeping. Stress may cause unhealthy behaviors, including the following:   Eating to feel better (overeating) or skipping meals.  Sleeping too little, too much, or both.  Working  too much or putting off tasks (procrastination).  Smoking, drinking alcohol, or using drugs to feel better. DIAGNOSIS  Stress is diagnosed through an assessment by your health care provider. Your health care provider will ask questions about your symptoms and any stressful life events.Your health care provider will also ask about your medical history and may order blood tests or other tests. Certain medical conditions and medicine can cause physical symptoms similar to stress. Mental illness can cause emotional symptoms and unhealthy behaviors similar to stress. Your health care provider may refer you to a mental health professional for further evaluation.  TREATMENT  Stress management is the recommended treatment for stress.The goals of stress management are reducing stressful life events and coping with stress in healthy ways.  Techniques for reducing stressful life events include the following:  Stress identification. Self-monitor for stress and identify what causes stress for you. These skills may help you to avoid some stressful events.  Time management. Set your priorities, keep a calendar of events, and learn to say "no." These tools can help you avoid making too many commitments. Techniques for coping with stress include the following:  Rethinking the problem. Try to think realistically about stressful events rather than ignoring them or overreacting. Try to find the positives in a stressful situation rather than focusing on the negatives.  Exercise. Physical exercise can release both physical and emotional tension. The key is to find a form of exercise you enjoy and do it regularly.  Relaxation techniques. These relax the body and mind. Examples include yoga, meditation, tai chi, biofeedback, deep  breathing, progressive muscle relaxation, listening to music, being out in nature, journaling, and other hobbies. Again, the key is to find one or more that you enjoy and can do  regularly.  Healthy lifestyle. Eat a balanced diet, get plenty of sleep, and do not smoke. Avoid using alcohol or drugs to relax.  Strong support network. Spend time with family, friends, or other people you enjoy being around.Express your feelings and talk things over with someone you trust. Counseling or talktherapy with a mental health professional may be helpful if you are having difficulty managing stress on your own. Medicine is typically not recommended for the treatment of stress.Talk to your health care provider if you think you need medicine for symptoms of stress. HOME CARE INSTRUCTIONS  Keep all follow-up visits as directed by your health care provider.  Take all medicines as directed by your health care provider. SEEK MEDICAL CARE IF:  Your symptoms get worse or you start having new symptoms.  You feel overwhelmed by your problems and can no longer manage them on your own. SEEK IMMEDIATE MEDICAL CARE IF:  You feel like hurting yourself or someone else.   This information is not intended to replace advice given to you by your health care provider. Make sure you discuss any questions you have with your health care provider.   Document Released: 05/31/2001 Document Revised: 12/26/2014 Document Reviewed: 07/30/2013 Elsevier Interactive Patient Education 2016 Elsevier Inc.  

## 2015-12-05 LAB — CMP14+EGFR
A/G RATIO: 1.9 (ref 1.1–2.5)
ALBUMIN: 4.5 g/dL (ref 3.5–5.5)
ALK PHOS: 98 IU/L (ref 39–117)
ALT: 17 IU/L (ref 0–32)
AST: 10 IU/L (ref 0–40)
BILIRUBIN TOTAL: 0.3 mg/dL (ref 0.0–1.2)
BUN / CREAT RATIO: 23 (ref 9–23)
BUN: 15 mg/dL (ref 6–24)
CO2: 24 mmol/L (ref 18–29)
CREATININE: 0.65 mg/dL (ref 0.57–1.00)
Calcium: 9.7 mg/dL (ref 8.7–10.2)
Chloride: 101 mmol/L (ref 96–106)
GFR calc Af Amer: 120 mL/min/{1.73_m2} (ref >59)
GFR calc non Af Amer: 104 mL/min/{1.73_m2} (ref >59)
GLOBULIN, TOTAL: 2.4 g/dL (ref 1.5–4.5)
Glucose: 94 mg/dL (ref 65–99)
POTASSIUM: 4.6 mmol/L (ref 3.5–5.2)
SODIUM: 138 mmol/L (ref 134–144)
Total Protein: 6.9 g/dL (ref 6.0–8.5)

## 2015-12-05 LAB — LIPID PANEL
CHOL/HDL RATIO: 2.9 ratio (ref 0.0–4.4)
CHOLESTEROL TOTAL: 135 mg/dL (ref 100–199)
HDL: 46 mg/dL (ref >39)
LDL CALC: 57 mg/dL (ref 0–99)
TRIGLYCERIDES: 160 mg/dL — AB (ref 0–149)
VLDL Cholesterol Cal: 32 mg/dL (ref 5–40)

## 2015-12-17 ENCOUNTER — Ambulatory Visit (INDEPENDENT_AMBULATORY_CARE_PROVIDER_SITE_OTHER): Payer: Medicare Other | Admitting: Pediatrics

## 2015-12-17 ENCOUNTER — Encounter: Payer: Self-pay | Admitting: Pediatrics

## 2015-12-17 VITALS — BP 107/75 | HR 74 | Temp 97.0°F | Ht 66.0 in | Wt 199.4 lb

## 2015-12-17 DIAGNOSIS — Z0000 Encounter for general adult medical examination without abnormal findings: Secondary | ICD-10-CM

## 2015-12-17 DIAGNOSIS — Z23 Encounter for immunization: Secondary | ICD-10-CM

## 2015-12-17 NOTE — Progress Notes (Signed)
Subjective:   Brianna Sanchez is a 49 y.o. female who presents for an Initial Medicare Annual Wellness Visit.  Generally feeling well Anxiety has good and bad days Has seen a psychologist in the past  Review of Systems  All systems negative other than what is in HPI  Current Medications (verified) Outpatient Encounter Prescriptions as of 12/17/2015  Medication Sig  . ALPRAZolam (XANAX) 1 MG tablet TAKE ONE TABLET TWICE A DAY.  Marland Kitchen atorvastatin (LIPITOR) 40 MG tablet Take 1 tablet (40 mg total) by mouth daily.  . Triamcinolone Acetonide (TRIAMCINOLONE 0.1 % CREAM : EUCERIN) CREA Apply 1 application topically 2 (two) times daily.  Marland Kitchen venlafaxine XR (EFFEXOR-XR) 150 MG 24 hr capsule TAKE (2) CAPSULES ONCE DAILY.   No facility-administered encounter medications on file as of 12/17/2015.    Allergies (verified) Codeine and Medroxyprogesterone   History: Past Medical History  Diagnosis Date  . GAD (generalized anxiety disorder)   . Depression   . Hyperlipidemia    Past Surgical History  Procedure Laterality Date  . Hemrrhoid     Family History  Problem Relation Age of Onset  . Cancer Father     stomach  . Diabetes Father   . Heart attack Father   . Heart disease Father    Social History   Occupational History  . Not on file.   Social History Main Topics  . Smoking status: Former Research scientist (life sciences)  . Smokeless tobacco: Not on file  . Alcohol Use: No  . Drug Use: No  . Sexual Activity: Not on file    Do you feel safe at home?  Yes  Dietary issues and exercise activities: Current Exercise Habits:: The patient does not participate in regular exercise at present  Current Dietary habits: not eating much fruits and vegetables  Objective:    Today's Vitals   12/17/15 1414  BP: 107/75  Pulse: 74  Temp: 97 F (36.1 C)  TempSrc: Oral  Height: 5\' 6"  (1.676 m)  Weight: 199 lb 6.4 oz (90.447 kg)   Body mass index is 32.2 kg/(m^2).  Activities of Daily Living In your  present state of health, do you have any difficulty performing the following activities: 12/17/2015  Hearing? N  Vision? N  Difficulty concentrating or making decisions? Y  Walking or climbing stairs? N  Dressing or bathing? N  Doing errands, shopping? N  Preparing Food and eating ? N  Using the Toilet? N  In the past six months, have you accidently leaked urine? N  Do you have problems with loss of bowel control? N  Managing your Medications? N  Managing your Finances? N  Housekeeping or managing your Housekeeping? N    Are there smokers in your home (other than you)? Yes, husband is a smoker   Cardiac Risk Factors include: obesity (BMI >30kg/m2);sedentary lifestyle  Depression Screen PHQ 2/9 Scores 12/17/2015 12/04/2015 04/23/2015 01/13/2015  PHQ - 2 Score 3 4 5 4   PHQ- 9 Score 12 13 20 15     Fall Risk Fall Risk  12/17/2015 12/04/2015 04/23/2015 01/13/2015 09/26/2014  Falls in the past year? No No No No No    Cognitive Function: MMSE - Mini Mental State Exam 12/17/2015  Orientation to time 5  Orientation to Place 5  Registration 3  Attention/ Calculation 2  Recall 2  Language- name 2 objects 2  Language- repeat 1  Language- follow 3 step command 3  Language- read & follow direction 1  Write a sentence 1  Copy design 1  Total score 26    Immunizations and Health Maintenance  There is no immunization history on file for this patient. There are no preventive care reminders to display for this patient.  Patient Care Team: Chevis Pretty, FNP as PCP - General (Nurse Practitioner)  Indicate any recent Medical Services you may have received from other than [facility] providers in the past year (date may be approximate).    Assessment:    Annual Wellness Visit    Screening Tests Health Maintenance  Topic Date Due  . MAMMOGRAM  12/30/2015 (Originally 06/04/2015)  . INFLUENZA VACCINE  03/18/2016 (Originally 07/20/2015)  . HIV Screening  04/22/2016 (Originally 02/21/1980)   . PAP SMEAR  06/03/2016 (Originally 05/10/2015)  . TETANUS/TDAP  06/03/2016 (Originally 02/21/1984)  . DEXA SCAN  04/22/2017  . COLONOSCOPY  07/06/2025        Plan:   During the course of the visit Brianna Sanchez was educated and counseled about the following appropriate screening and preventive services:   Vaccines to include Influenza today  Colorectal cancer screening--completed over the summer, Dr. Glennon Hamilton  Cardiovascular disease screening--UTD  Diabetes screening--UTD  Bone Denisty / Osteoporosis Screening--completed  Mammogram--one scheduled for next month  PAP--scheduled with Shelah Lewandowsky in March 2017  Nutrition counseling--completed  Smoking cessation counseling--completed, smokes a vape now, husband still smoking at home  Advanced Directives--none, pt has had discussions with her husband and fmaily abou twhat she would and would not want done   Goals    None       Patient Instructions (the written plan) were given to the patient.   Brianna Maize, MD   12/17/2015

## 2016-01-19 DIAGNOSIS — Z1231 Encounter for screening mammogram for malignant neoplasm of breast: Secondary | ICD-10-CM | POA: Diagnosis not present

## 2016-01-19 LAB — HM MAMMOGRAPHY: HM MAMMO: NEGATIVE

## 2016-01-22 ENCOUNTER — Encounter: Payer: Self-pay | Admitting: *Deleted

## 2016-01-25 ENCOUNTER — Other Ambulatory Visit: Payer: Self-pay | Admitting: Nurse Practitioner

## 2016-01-26 NOTE — Telephone Encounter (Signed)
rx called into pharmacy

## 2016-01-26 NOTE — Telephone Encounter (Signed)
Last seen 12/17/15  Dr Evette Doffing  Last lipid 04/23/15   If approved route to nurse to call into [facility]  [phone]

## 2016-01-26 NOTE — Telephone Encounter (Signed)
Please call in xanax with 1 refills 

## 2016-01-26 NOTE — Telephone Encounter (Signed)
Forwarding to MMM, pt of hers.

## 2016-02-22 ENCOUNTER — Other Ambulatory Visit: Payer: Self-pay | Admitting: Nurse Practitioner

## 2016-03-03 ENCOUNTER — Encounter: Payer: Self-pay | Admitting: Nurse Practitioner

## 2016-03-03 ENCOUNTER — Ambulatory Visit (INDEPENDENT_AMBULATORY_CARE_PROVIDER_SITE_OTHER): Payer: Medicare Other | Admitting: Nurse Practitioner

## 2016-03-03 VITALS — BP 111/74 | HR 85 | Temp 97.5°F | Ht 66.0 in | Wt 200.0 lb

## 2016-03-03 DIAGNOSIS — E785 Hyperlipidemia, unspecified: Secondary | ICD-10-CM

## 2016-03-03 DIAGNOSIS — Z202 Contact with and (suspected) exposure to infections with a predominantly sexual mode of transmission: Secondary | ICD-10-CM

## 2016-03-03 DIAGNOSIS — Z01419 Encounter for gynecological examination (general) (routine) without abnormal findings: Secondary | ICD-10-CM

## 2016-03-03 DIAGNOSIS — F411 Generalized anxiety disorder: Secondary | ICD-10-CM

## 2016-03-03 DIAGNOSIS — F32A Depression, unspecified: Secondary | ICD-10-CM

## 2016-03-03 DIAGNOSIS — Z6832 Body mass index (BMI) 32.0-32.9, adult: Secondary | ICD-10-CM

## 2016-03-03 DIAGNOSIS — F329 Major depressive disorder, single episode, unspecified: Secondary | ICD-10-CM | POA: Diagnosis not present

## 2016-03-03 DIAGNOSIS — Z0000 Encounter for general adult medical examination without abnormal findings: Secondary | ICD-10-CM

## 2016-03-03 LAB — URINALYSIS, COMPLETE
BILIRUBIN UA: NEGATIVE
GLUCOSE, UA: NEGATIVE
KETONES UA: NEGATIVE
LEUKOCYTES UA: NEGATIVE
NITRITE UA: NEGATIVE
Protein, UA: NEGATIVE
RBC UA: NEGATIVE
SPEC GRAV UA: 1.015 (ref 1.005–1.030)
Urobilinogen, Ur: 0.2 mg/dL (ref 0.2–1.0)
pH, UA: 8 — ABNORMAL HIGH (ref 5.0–7.5)

## 2016-03-03 LAB — MICROSCOPIC EXAMINATION
BACTERIA UA: NONE SEEN
RBC, UA: NONE SEEN /hpf (ref 0–?)

## 2016-03-03 MED ORDER — ALPRAZOLAM 1 MG PO TABS: ORAL_TABLET | ORAL | Status: DC

## 2016-03-03 MED ORDER — VENLAFAXINE HCL ER 150 MG PO CP24: ORAL_CAPSULE | ORAL | Status: DC

## 2016-03-03 MED ORDER — ATORVASTATIN CALCIUM 40 MG PO TABS: 40.0000 mg | ORAL_TABLET | Freq: Every day | ORAL | Status: DC

## 2016-03-03 NOTE — Progress Notes (Signed)
Subjective:    Patient ID: Brianna Sanchez, female    DOB: Aug 18, 1966, 50 y.o.   MRN: [id number]  Patient here today for annual physical exam and follow up of chronic medical problems.   Outpatient Encounter Prescriptions as of 03/03/2016  Medication Sig  . ALPRAZolam (XANAX) 1 MG tablet TAKE (1) TABLET TWICE A DAY AS NEEDED.  Marland Kitchen atorvastatin (LIPITOR) 40 MG tablet TAKE 1 TABLET ONCE DAILY.  . Triamcinolone Acetonide (TRIAMCINOLONE 0.1 % CREAM : EUCERIN) CREA Apply 1 application topically 2 (two) times daily.  Marland Kitchen venlafaxine XR (EFFEXOR-XR) 150 MG 24 hr capsule TAKE (2) CAPSULES ONCE DAILY.   No facility-administered encounter medications on file as of 03/03/2016.    Hyperlipidemia This is a chronic problem. The current episode started more than 1 year ago. The problem is controlled. Recent lipid tests were reviewed and are normal. Exacerbating diseases include obesity. She has no history of diabetes or hypothyroidism. There are no known factors aggravating her hyperlipidemia. Pertinent negatives include no myalgias. Current antihyperlipidemic treatment includes statins. The current treatment provides moderate improvement of lipids. Compliance problems include adherence to exercise and adherence to diet.  Risk factors for coronary artery disease include dyslipidemia and obesity.  Gad/Depression Currently on effexor ( was increased to 361m at last visit) and xanax- she is doing okay- still doesn't like getting out  Of house and doing things- She is some better since last visit. She said that her husband and her are still separated and she has bad days but she does not want to change her meds at this time.   * Review of Systems  Constitutional: Negative for fever, activity change, appetite change, fatigue and unexpected weight change.  HENT: Negative.   Eyes: Negative.   Respiratory: Negative.   Gastrointestinal: Negative.   Genitourinary: Negative.   Musculoskeletal: Negative.  Negative for  myalgias.  Neurological: Negative.   Hematological: Negative.        Objective:   Physical Exam  Constitutional: She is oriented to person, place, and time. She appears well-developed and well-nourished.  HENT:  Head: Normocephalic.  Right Ear: Hearing, tympanic membrane, external ear and ear canal normal.  Left Ear: Hearing, tympanic membrane, external ear and ear canal normal.  Nose: Nose normal.  Mouth/Throat: Uvula is midline and oropharynx is clear and moist.  Eyes: Conjunctivae and EOM are normal. Pupils are equal, round, and reactive to light.  Neck: Trachea normal, normal range of motion and full passive range of motion without pain. Neck supple. No JVD present. Carotid bruit is not present. No thyroid mass and no thyromegaly present.  Cardiovascular: Normal rate, regular rhythm, normal heart sounds and intact distal pulses.  Exam reveals no gallop and no friction rub.   No murmur heard. Pulmonary/Chest: Effort normal and breath sounds normal. Right breast exhibits no inverted nipple, no mass, no nipple discharge, no skin change and no tenderness. Left breast exhibits no inverted nipple, no mass, no nipple discharge, no skin change and no tenderness.  Abdominal: Soft. Bowel sounds are normal. She exhibits no distension and no mass. There is no tenderness (mild right upper quadrant pain on palpation).  Genitourinary: Vagina normal and uterus normal. No breast swelling, tenderness, discharge or bleeding.  bimanual exam-No adnexal masses or tenderness. Cervix parous and pink- no discharge  Musculoskeletal: Normal range of motion.  Lymphadenopathy:    She has no cervical adenopathy.  Neurological: She is alert and oriented to person, place, and time. She has normal reflexes.  Skin:  Skin is warm and dry.  Psychiatric: She has a normal mood and affect. Her behavior is normal. Judgment and thought content normal.    BP 111/74 mmHg  Pulse 85  Temp(Src) 97.5 F (36.4 C) (Oral)  Ht  '5\' 6"'  (1.676 m)  Wt 200 lb (90.719 kg)  BMI 32.30 kg/m2       Assessment & Plan:  1. Annual physical exam - Urinalysis, Complete - Thyroid Panel With TSH  2. Encounter for routine gynecological examination - Pap IG w/ reflex to HPV when ASC-U  3. Hyperlipidemia Low fat diet - atorvastatin (LIPITOR) 40 MG tablet; Take 1 tablet (40 mg total) by mouth daily.  Dispense: 30 tablet; Refill: 5 - CMP14+EGFR - Lipid panel - CBC with Differential/Platelet  4. GAD (generalized anxiety disorder) Stress management - ALPRAZolam (XANAX) 1 MG tablet; TAKE (1) TABLET TWICE A DAY AS NEEDED.  Dispense: 60 tablet; Refill: 1  5. Depression - venlafaxine XR (EFFEXOR-XR) 150 MG 24 hr capsule; TAKE (2) CAPSULES ONCE DAILY.  Dispense: 60 capsule; Refill: 5  6. BMI 32.0-32.9,adult Discussed diet and exercise for person with BMI >25 Will recheck weight in 3-6 months     Labs pending Health maintenance reviewed Diet and exercise encouraged Continue all meds Follow up  In 3 months   [place], FNP

## 2016-03-03 NOTE — Patient Instructions (Signed)
Stress and Stress Management Stress is a normal reaction to life events. It is what you feel when life demands more than you are used to or more than you can handle. Some stress can be useful. For example, the stress reaction can help you catch the last bus of the day, study for a test, or meet a deadline at work. But stress that occurs too often or for too long can cause problems. It can affect your emotional health and interfere with relationships and normal daily activities. Too much stress can weaken your immune system and increase your risk for physical illness. If you already have a medical problem, stress can make it worse. CAUSES  All sorts of life events may cause stress. An event that causes stress for one person may not be stressful for another person. Major life events commonly cause stress. These may be positive or negative. Examples include losing your job, moving into a new home, getting married, having a baby, or losing a loved one. Less obvious life events may also cause stress, especially if they occur day after day or in combination. Examples include working long hours, driving in traffic, caring for children, being in debt, or being in a difficult relationship. SIGNS AND SYMPTOMS Stress may cause emotional symptoms including, the following:  Anxiety. This is feeling worried, afraid, on edge, overwhelmed, or out of control.  Anger. This is feeling irritated or impatient.  Depression. This is feeling sad, down, helpless, or guilty.  Difficulty focusing, remembering, or making decisions. Stress may cause physical symptoms, including the following:   Aches and pains. These may affect your head, neck, back, stomach, or other areas of your body.  Tight muscles or clenched jaw.  Low energy or trouble sleeping. Stress may cause unhealthy behaviors, including the following:   Eating to feel better (overeating) or skipping meals.  Sleeping too little, too much, or both.  Working  too much or putting off tasks (procrastination).  Smoking, drinking alcohol, or using drugs to feel better. DIAGNOSIS  Stress is diagnosed through an assessment by your health care provider. Your health care provider will ask questions about your symptoms and any stressful life events.Your health care provider will also ask about your medical history and may order blood tests or other tests. Certain medical conditions and medicine can cause physical symptoms similar to stress. Mental illness can cause emotional symptoms and unhealthy behaviors similar to stress. Your health care provider may refer you to a mental health professional for further evaluation.  TREATMENT  Stress management is the recommended treatment for stress.The goals of stress management are reducing stressful life events and coping with stress in healthy ways.  Techniques for reducing stressful life events include the following:  Stress identification. Self-monitor for stress and identify what causes stress for you. These skills may help you to avoid some stressful events.  Time management. Set your priorities, keep a calendar of events, and learn to say "no." These tools can help you avoid making too many commitments. Techniques for coping with stress include the following:  Rethinking the problem. Try to think realistically about stressful events rather than ignoring them or overreacting. Try to find the positives in a stressful situation rather than focusing on the negatives.  Exercise. Physical exercise can release both physical and emotional tension. The key is to find a form of exercise you enjoy and do it regularly.  Relaxation techniques. These relax the body and mind. Examples include yoga, meditation, tai chi, biofeedback, deep  breathing, progressive muscle relaxation, listening to music, being out in nature, journaling, and other hobbies. Again, the key is to find one or more that you enjoy and can do  regularly.  Healthy lifestyle. Eat a balanced diet, get plenty of sleep, and do not smoke. Avoid using alcohol or drugs to relax.  Strong support network. Spend time with family, friends, or other people you enjoy being around.Express your feelings and talk things over with someone you trust. Counseling or talktherapy with a mental health professional may be helpful if you are having difficulty managing stress on your own. Medicine is typically not recommended for the treatment of stress.Talk to your health care provider if you think you need medicine for symptoms of stress. HOME CARE INSTRUCTIONS  Keep all follow-up visits as directed by your health care provider.  Take all medicines as directed by your health care provider. SEEK MEDICAL CARE IF:  Your symptoms get worse or you start having new symptoms.  You feel overwhelmed by your problems and can no longer manage them on your own. SEEK IMMEDIATE MEDICAL CARE IF:  You feel like hurting yourself or someone else.   This information is not intended to replace advice given to you by your health care provider. Make sure you discuss any questions you have with your health care provider.   Document Released: 05/31/2001 Document Revised: 12/26/2014 Document Reviewed: 07/30/2013 Elsevier Interactive Patient Education 2016 Elsevier Inc.  

## 2016-03-04 ENCOUNTER — Telehealth: Payer: Self-pay | Admitting: Nurse Practitioner

## 2016-03-04 LAB — CMP14+EGFR
A/G RATIO: 1.7 (ref 1.2–2.2)
ALK PHOS: 105 IU/L (ref 39–117)
ALT: 29 IU/L (ref 0–32)
AST: 16 IU/L (ref 0–40)
Albumin: 4.5 g/dL (ref 3.5–5.5)
BUN/Creatinine Ratio: 16 (ref 9–23)
BUN: 11 mg/dL (ref 6–24)
Bilirubin Total: 0.3 mg/dL (ref 0.0–1.2)
CALCIUM: 9.7 mg/dL (ref 8.7–10.2)
CHLORIDE: 100 mmol/L (ref 96–106)
CO2: 23 mmol/L (ref 18–29)
Creatinine, Ser: 0.7 mg/dL (ref 0.57–1.00)
GFR calc Af Amer: 116 mL/min/{1.73_m2} (ref >59)
GFR, EST NON AFRICAN AMERICAN: 101 mL/min/{1.73_m2} (ref >59)
Globulin, Total: 2.7 g/dL (ref 1.5–4.5)
Glucose: 102 mg/dL — ABNORMAL HIGH (ref 65–99)
POTASSIUM: 4.9 mmol/L (ref 3.5–5.2)
SODIUM: 140 mmol/L (ref 134–144)
Total Protein: 7.2 g/dL (ref 6.0–8.5)

## 2016-03-04 LAB — STD SCREEN (8)
HEP B C IGM: NEGATIVE
HIV SCREEN 4TH GENERATION: NONREACTIVE
HSV 1 GLYCOPROTEIN G AB, IGG: 26.5 {index} — AB (ref 0.00–0.90)
HSV 2 GLYCOPROTEIN G AB, IGG: 15.7 {index} — AB (ref 0.00–0.90)
Hep A IgM: NEGATIVE
Hepatitis B Surface Ag: NEGATIVE
RPR Ser Ql: NONREACTIVE

## 2016-03-04 LAB — THYROID PANEL WITH TSH
Free Thyroxine Index: 1.9 (ref 1.2–4.9)
T3 UPTAKE RATIO: 24 % (ref 24–39)
T4, Total: 8.1 ug/dL (ref 4.5–12.0)
TSH: 0.829 u[IU]/mL (ref 0.450–4.500)

## 2016-03-04 LAB — CBC WITH DIFFERENTIAL/PLATELET
BASOS: 1 %
Basophils Absolute: 0 10*3/uL (ref 0.0–0.2)
EOS (ABSOLUTE): 0.1 10*3/uL (ref 0.0–0.4)
EOS: 1 %
HEMOGLOBIN: 13.8 g/dL (ref 11.1–15.9)
Hematocrit: 41.1 % (ref 34.0–46.6)
IMMATURE GRANS (ABS): 0 10*3/uL (ref 0.0–0.1)
IMMATURE GRANULOCYTES: 0 %
LYMPHS: 32 %
Lymphocytes Absolute: 1.7 10*3/uL (ref 0.7–3.1)
MCH: 30.1 pg (ref 26.6–33.0)
MCHC: 33.6 g/dL (ref 31.5–35.7)
MCV: 90 fL (ref 79–97)
MONOCYTES: 6 %
Monocytes Absolute: 0.3 10*3/uL (ref 0.1–0.9)
NEUTROS ABS: 3.2 10*3/uL (ref 1.4–7.0)
NEUTROS PCT: 60 %
PLATELETS: 406 10*3/uL — AB (ref 150–379)
RBC: 4.58 x10E6/uL (ref 3.77–5.28)
RDW: 13.9 % (ref 12.3–15.4)
WBC: 5.4 10*3/uL (ref 3.4–10.8)

## 2016-03-04 LAB — LIPID PANEL
CHOL/HDL RATIO: 2.9 ratio (ref 0.0–4.4)
CHOLESTEROL TOTAL: 148 mg/dL (ref 100–199)
HDL: 51 mg/dL (ref >39)
LDL Calculated: 67 mg/dL (ref 0–99)
TRIGLYCERIDES: 149 mg/dL (ref 0–149)
VLDL Cholesterol Cal: 30 mg/dL (ref 5–40)

## 2016-03-04 NOTE — Telephone Encounter (Signed)
Aware of lab results  

## 2016-03-04 NOTE — Telephone Encounter (Signed)
For clinical-

## 2016-03-07 ENCOUNTER — Telehealth: Payer: Self-pay | Admitting: Nurse Practitioner

## 2016-03-07 LAB — PAP IG W/ RFLX HPV ASCU: PAP SMEAR COMMENT: 0

## 2016-05-10 IMAGING — CR DG CHEST 2V
2 series · 2 of 2 positions shown · non-contrast
Comparison: None.

CLINICAL DATA: Smoking history.

EXAM:
CHEST  2 VIEW

[view not recorded (1 of 2)]
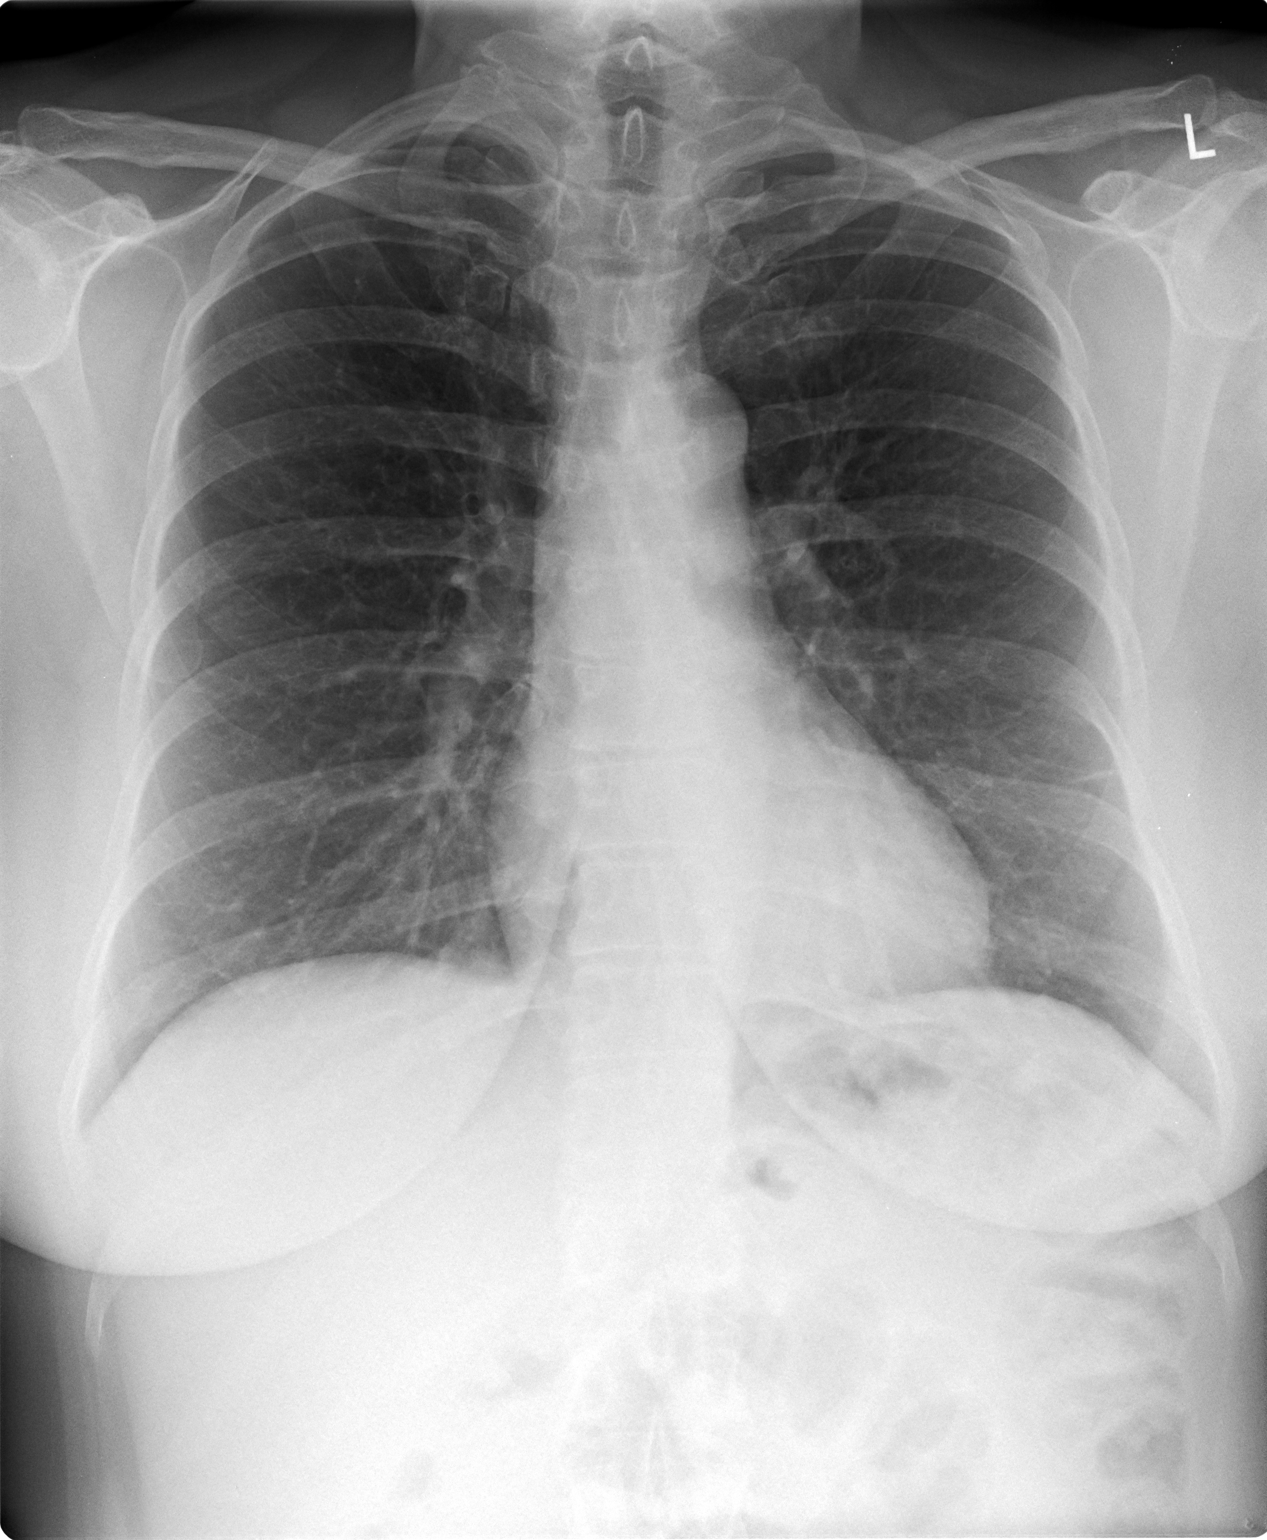

[view not recorded (2 of 2)]
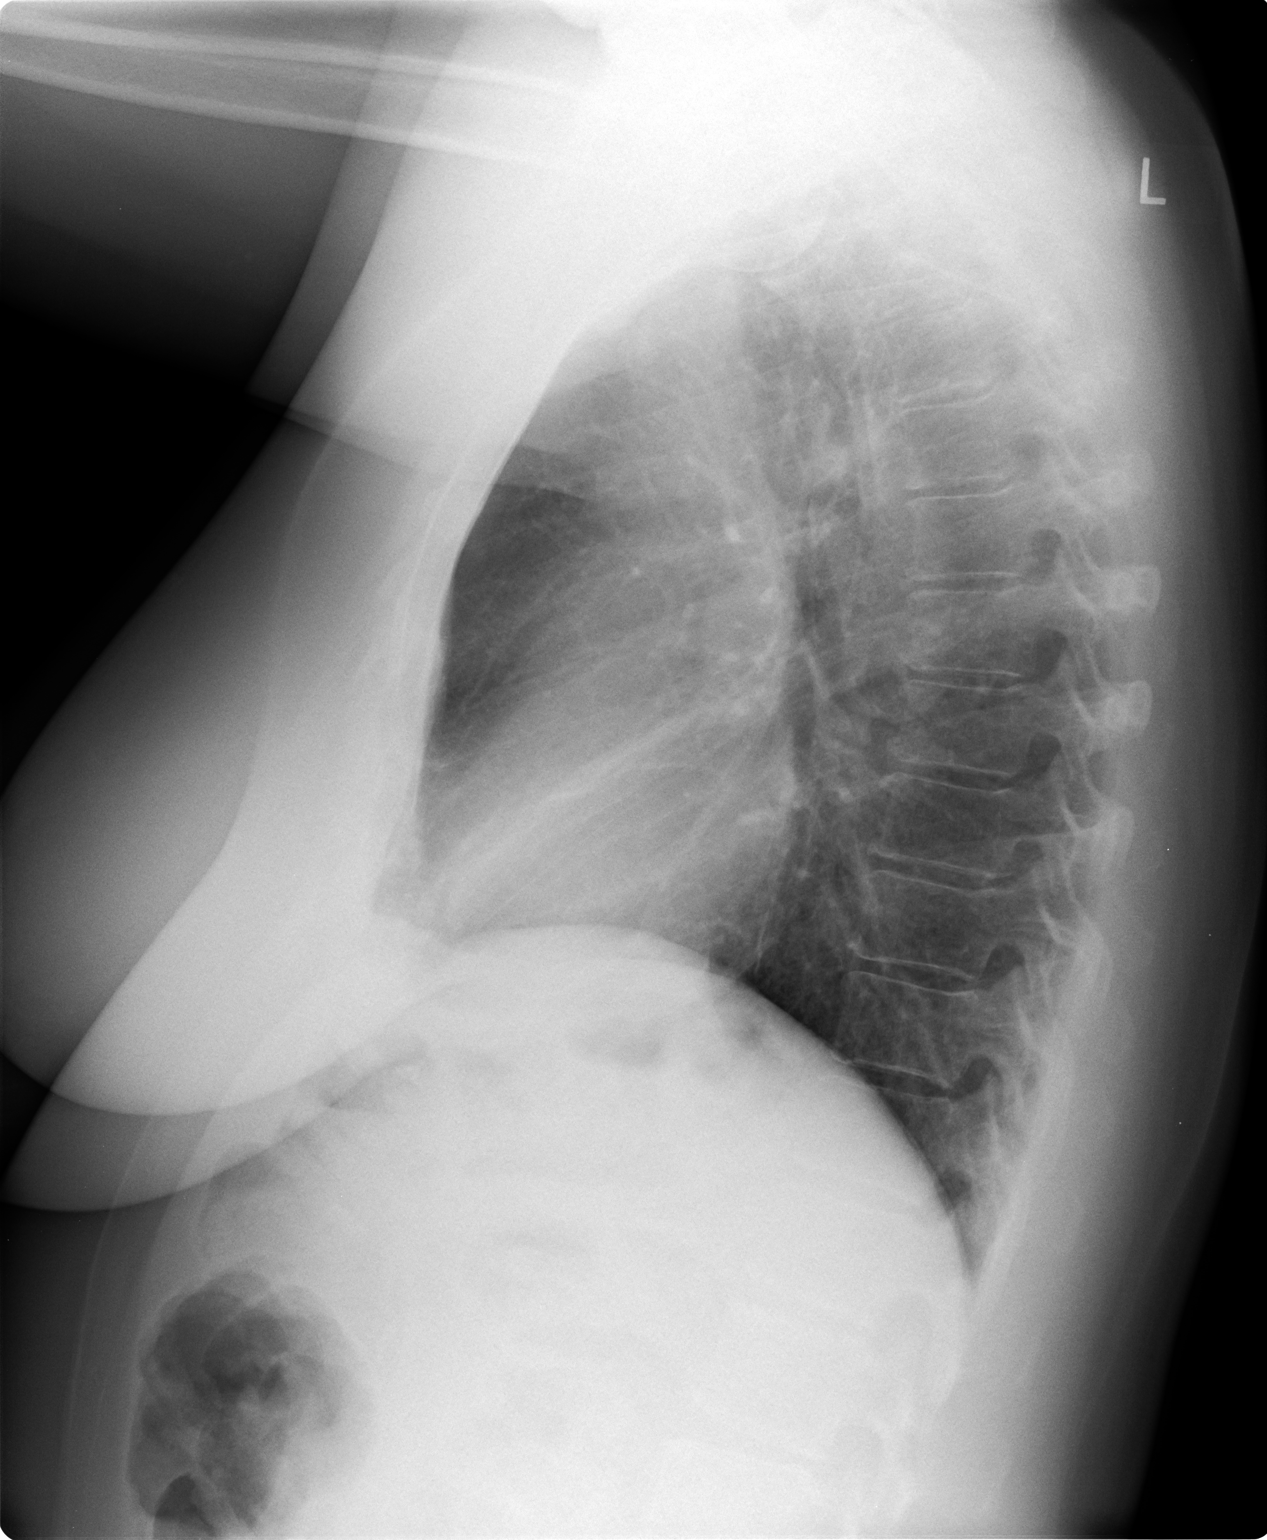

[2 of 2 positions shown; findings below may reference images not displayed]

FINDINGS: Mediastinum hilar structures normal. Heart size normal. Pulmonary
vascularity normal. Mild left base subsegmental atelectasis. No
pleural effusion or pneumothorax. No acute bony abnormality.
IMPRESSION: Mild left base subsegmental atelectasis.

## 2016-05-23 ENCOUNTER — Other Ambulatory Visit: Payer: Self-pay | Admitting: Nurse Practitioner

## 2016-05-23 NOTE — Telephone Encounter (Signed)
Last seen 03/03/16  MMM If approved route to nurse to call into [facility]  [phone]

## 2016-05-23 NOTE — Telephone Encounter (Signed)
Last seen 03/03/16  MMM  If approved route to nurse to call into

## 2016-05-23 NOTE — Telephone Encounter (Signed)
Please call in xanax with 1 refills 

## 2016-06-20 ENCOUNTER — Encounter: Payer: Self-pay | Admitting: Nurse Practitioner

## 2016-06-20 ENCOUNTER — Ambulatory Visit (INDEPENDENT_AMBULATORY_CARE_PROVIDER_SITE_OTHER): Payer: Medicare Other | Admitting: Nurse Practitioner

## 2016-06-20 VITALS — BP 125/94 | HR 73 | Temp 96.9°F | Ht 66.0 in | Wt 197.0 lb

## 2016-06-20 DIAGNOSIS — Z6832 Body mass index (BMI) 32.0-32.9, adult: Secondary | ICD-10-CM

## 2016-06-20 DIAGNOSIS — F32A Depression, unspecified: Secondary | ICD-10-CM

## 2016-06-20 DIAGNOSIS — F411 Generalized anxiety disorder: Secondary | ICD-10-CM

## 2016-06-20 DIAGNOSIS — F329 Major depressive disorder, single episode, unspecified: Secondary | ICD-10-CM | POA: Diagnosis not present

## 2016-06-20 DIAGNOSIS — E785 Hyperlipidemia, unspecified: Secondary | ICD-10-CM

## 2016-06-20 MED ORDER — ATORVASTATIN CALCIUM 40 MG PO TABS
40.0000 mg | ORAL_TABLET | Freq: Every day | ORAL | Status: DC
Start: 2016-06-20 — End: 2016-12-21

## 2016-06-20 MED ORDER — ALPRAZOLAM 1 MG PO TABS: 1.0000 mg | ORAL_TABLET | Freq: Two times a day (BID) | ORAL | Status: DC | PRN

## 2016-06-20 MED ORDER — VENLAFAXINE HCL ER 150 MG PO CP24: ORAL_CAPSULE | ORAL | Status: DC

## 2016-06-20 NOTE — Progress Notes (Signed)
Subjective:    Patient ID: Brianna Sanchez, female    DOB: 1965-12-22, 50 y.o.   MRN: [id number]  Patient here today for follow up of chronic medical problems.  Outpatient Encounter Prescriptions as of 06/20/2016  Medication Sig  . ALPRAZolam (XANAX) 1 MG tablet TAKE (1) TABLET TWICE A DAY AS NEEDED.  Marland Kitchen atorvastatin (LIPITOR) 40 MG tablet Take 1 tablet (40 mg total) by mouth daily.  . Triamcinolone Acetonide (TRIAMCINOLONE 0.1 % CREAM : EUCERIN) CREA Apply 1 application topically 2 (two) times daily.  Marland Kitchen venlafaxine XR (EFFEXOR-XR) 150 MG 24 hr capsule TAKE (2) CAPSULES ONCE DAILY.   No facility-administered encounter medications on file as of 06/20/2016.     Hyperlipidemia This is a chronic problem. The current episode started more than 1 year ago. The problem is controlled. Recent lipid tests were reviewed and are normal. Exacerbating diseases include obesity. She has no history of diabetes or hypothyroidism. There are no known factors aggravating her hyperlipidemia. Pertinent negatives include no myalgias. Current antihyperlipidemic treatment includes statins. The current treatment provides moderate improvement of lipids. Compliance problems include adherence to exercise and adherence to diet.  Risk factors for coronary artery disease include dyslipidemia and obesity.  Gad/Depression Currently on effexor ( was increased to 359m at last visit) and xanax- she is doing okay- still doesn't like getting out  Of house and doing things- Her marital situation as improved but she stills stays stressed.  * Review of Systems  Constitutional: Negative for fever, activity change, appetite change, fatigue and unexpected weight change.  HENT: Negative.   Eyes: Negative.   Respiratory: Negative.   Gastrointestinal: Negative.   Genitourinary: Negative.   Musculoskeletal: Negative.  Negative for myalgias.  Neurological: Negative.   Hematological: Negative.        Objective:   Physical Exam   Constitutional: She is oriented to person, place, and time. She appears well-developed and well-nourished.  HENT:  Nose: Nose normal.  Mouth/Throat: Oropharynx is clear and moist.  Eyes: EOM are normal.  Neck: Trachea normal, normal range of motion and full passive range of motion without pain. Neck supple. No JVD present. Carotid bruit is not present. No thyromegaly present.  Cardiovascular: Normal rate, regular rhythm, normal heart sounds and intact distal pulses.  Exam reveals no gallop and no friction rub.   No murmur heard. Pulmonary/Chest: Effort normal and breath sounds normal.  Abdominal: Soft. Bowel sounds are normal. She exhibits no distension and no mass. There is no tenderness (mild right upper quadrant pain on palpation).  Musculoskeletal: Normal range of motion.  Lymphadenopathy:    She has no cervical adenopathy.  Neurological: She is alert and oriented to person, place, and time. She has normal reflexes.  Skin: Skin is warm and dry.  Psychiatric: She has a normal mood and affect. Her behavior is normal. Judgment and thought content normal.    BP 125/94 mmHg  Pulse 73  Temp(Src) 96.9 F (36.1 C) (Oral)  Ht 5' 6" (1.676 m)  Wt 197 lb (89.359 kg)  BMI 31.81 kg/m2     Assessment & Plan:   1. Hyperlipidemia Low fat diet - atorvastatin (LIPITOR) 40 MG tablet; Take 1 tablet (40 mg total) by mouth daily.  Dispense: 30 tablet; Refill: 5 - CMP14+EGFR - Lipid panel  2. GAD (generalized anxiety disorder) Stress management - ALPRAZolam (XANAX) 1 MG tablet; Take 1 tablet (1 mg total) by mouth 2 (two) times daily as needed for anxiety.  Dispense: 60 tablet; Refill: 1  3. Depression - venlafaxine XR (EFFEXOR-XR) 150 MG 24 hr capsule; TAKE (2) CAPSULES ONCE DAILY.  Dispense: 60 capsule; Refill: 5  4. BMI 32.0-32.9,adult Discussed diet and exercise for person with BMI >25 Will recheck weight in 3-6 months     Labs pending Health maintenance reviewed Diet and  exercise encouraged Continue all meds Follow up  In 6 months   [place], FNP

## 2016-06-20 NOTE — Patient Instructions (Signed)
Stress and Stress Management Stress is a normal reaction to life events. It is what you feel when life demands more than you are used to or more than you can handle. Some stress can be useful. For example, the stress reaction can help you catch the last bus of the day, study for a test, or meet a deadline at work. But stress that occurs too often or for too long can cause problems. It can affect your emotional health and interfere with relationships and normal daily activities. Too much stress can weaken your immune system and increase your risk for physical illness. If you already have a medical problem, stress can make it worse. CAUSES  All sorts of life events may cause stress. An event that causes stress for one person may not be stressful for another person. Major life events commonly cause stress. These may be positive or negative. Examples include losing your job, moving into a new home, getting married, having a baby, or losing a loved one. Less obvious life events may also cause stress, especially if they occur day after day or in combination. Examples include working long hours, driving in traffic, caring for children, being in debt, or being in a difficult relationship. SIGNS AND SYMPTOMS Stress may cause emotional symptoms including, the following:  Anxiety. This is feeling worried, afraid, on edge, overwhelmed, or out of control.  Anger. This is feeling irritated or impatient.  Depression. This is feeling sad, down, helpless, or guilty.  Difficulty focusing, remembering, or making decisions. Stress may cause physical symptoms, including the following:   Aches and pains. These may affect your head, neck, back, stomach, or other areas of your body.  Tight muscles or clenched jaw.  Low energy or trouble sleeping. Stress may cause unhealthy behaviors, including the following:   Eating to feel better (overeating) or skipping meals.  Sleeping too little, too much, or both.  Working  too much or putting off tasks (procrastination).  Smoking, drinking alcohol, or using drugs to feel better. DIAGNOSIS  Stress is diagnosed through an assessment by your health care provider. Your health care provider will ask questions about your symptoms and any stressful life events.Your health care provider will also ask about your medical history and may order blood tests or other tests. Certain medical conditions and medicine can cause physical symptoms similar to stress. Mental illness can cause emotional symptoms and unhealthy behaviors similar to stress. Your health care provider may refer you to a mental health professional for further evaluation.  TREATMENT  Stress management is the recommended treatment for stress.The goals of stress management are reducing stressful life events and coping with stress in healthy ways.  Techniques for reducing stressful life events include the following:  Stress identification. Self-monitor for stress and identify what causes stress for you. These skills may help you to avoid some stressful events.  Time management. Set your priorities, keep a calendar of events, and learn to say "no." These tools can help you avoid making too many commitments. Techniques for coping with stress include the following:  Rethinking the problem. Try to think realistically about stressful events rather than ignoring them or overreacting. Try to find the positives in a stressful situation rather than focusing on the negatives.  Exercise. Physical exercise can release both physical and emotional tension. The key is to find a form of exercise you enjoy and do it regularly.  Relaxation techniques. These relax the body and mind. Examples include yoga, meditation, tai chi, biofeedback, deep  breathing, progressive muscle relaxation, listening to music, being out in nature, journaling, and other hobbies. Again, the key is to find one or more that you enjoy and can do  regularly.  Healthy lifestyle. Eat a balanced diet, get plenty of sleep, and do not smoke. Avoid using alcohol or drugs to relax.  Strong support network. Spend time with family, friends, or other people you enjoy being around.Express your feelings and talk things over with someone you trust. Counseling or talktherapy with a mental health professional may be helpful if you are having difficulty managing stress on your own. Medicine is typically not recommended for the treatment of stress.Talk to your health care provider if you think you need medicine for symptoms of stress. HOME CARE INSTRUCTIONS  Keep all follow-up visits as directed by your health care provider.  Take all medicines as directed by your health care provider. SEEK MEDICAL CARE IF:  Your symptoms get worse or you start having new symptoms.  You feel overwhelmed by your problems and can no longer manage them on your own. SEEK IMMEDIATE MEDICAL CARE IF:  You feel like hurting yourself or someone else.   This information is not intended to replace advice given to you by your health care provider. Make sure you discuss any questions you have with your health care provider.   Document Released: 05/31/2001 Document Revised: 12/26/2014 Document Reviewed: 07/30/2013 Elsevier Interactive Patient Education 2016 Elsevier Inc.  

## 2016-06-21 LAB — CMP14+EGFR
A/G RATIO: 2 (ref 1.2–2.2)
ALBUMIN: 4.9 g/dL (ref 3.5–5.5)
ALK PHOS: 100 IU/L (ref 39–117)
ALT: 26 IU/L (ref 0–32)
AST: 18 IU/L (ref 0–40)
BILIRUBIN TOTAL: 0.3 mg/dL (ref 0.0–1.2)
BUN / CREAT RATIO: 19 (ref 9–23)
BUN: 12 mg/dL (ref 6–24)
CHLORIDE: 100 mmol/L (ref 96–106)
CO2: 22 mmol/L (ref 18–29)
Calcium: 9.7 mg/dL (ref 8.7–10.2)
Creatinine, Ser: 0.64 mg/dL (ref 0.57–1.00)
GFR calc non Af Amer: 104 mL/min/{1.73_m2} (ref >59)
GFR, EST AFRICAN AMERICAN: 119 mL/min/{1.73_m2} (ref >59)
GLUCOSE: 99 mg/dL (ref 65–99)
Globulin, Total: 2.4 g/dL (ref 1.5–4.5)
Potassium: 5.2 mmol/L (ref 3.5–5.2)
SODIUM: 140 mmol/L (ref 134–144)
Total Protein: 7.3 g/dL (ref 6.0–8.5)

## 2016-06-21 LAB — LIPID PANEL
CHOLESTEROL TOTAL: 134 mg/dL (ref 100–199)
Chol/HDL Ratio: 2.9 ratio units (ref 0.0–4.4)
HDL: 47 mg/dL (ref >39)
LDL Calculated: 64 mg/dL (ref 0–99)
TRIGLYCERIDES: 113 mg/dL (ref 0–149)
VLDL Cholesterol Cal: 23 mg/dL (ref 5–40)

## 2016-06-22 ENCOUNTER — Telehealth: Payer: Self-pay | Admitting: *Deleted

## 2016-06-22 NOTE — Telephone Encounter (Signed)
-----   Message from [facility], [place] sent at 06/21/2016  8:17 AM EDT ----- Kidney and liver function stable Cholesterol looks good Continue current meds- low fat diet and exercise and recheck in 3 months

## 2016-06-22 NOTE — Telephone Encounter (Signed)
Pt notified of results Verbalizes understanding 

## 2016-06-23 ENCOUNTER — Other Ambulatory Visit: Payer: Self-pay | Admitting: *Deleted

## 2016-07-06 ENCOUNTER — Other Ambulatory Visit: Payer: Self-pay | Admitting: *Deleted

## 2016-07-06 ENCOUNTER — Other Ambulatory Visit: Payer: Self-pay

## 2016-07-06 MED ORDER — TRIAMCINOLONE 0.1 % CREAM:EUCERIN CREAM 1:1: 1.0000 "application " | TOPICAL_CREAM | Freq: Two times a day (BID) | CUTANEOUS | Status: DC

## 2016-09-28 ENCOUNTER — Other Ambulatory Visit: Payer: Self-pay | Admitting: *Deleted

## 2016-09-28 DIAGNOSIS — F411 Generalized anxiety disorder: Secondary | ICD-10-CM

## 2016-09-28 NOTE — Telephone Encounter (Signed)
She filled #60 tabs on 10/6 per the database, should not need refill now

## 2016-09-28 NOTE — Telephone Encounter (Signed)
TC to [facility] to inform them of our providers recommendations. Brianna Sanchez informed me that this medication was filled on 10/6 and was there waiting on the patient to be picked up.

## 2016-09-28 NOTE — Telephone Encounter (Signed)
Last filled 08/24/16, last seen 06/20/16. MMM pt. Route to pool B !

## 2016-10-26 ENCOUNTER — Other Ambulatory Visit: Payer: Self-pay | Admitting: Nurse Practitioner

## 2016-10-26 DIAGNOSIS — F411 Generalized anxiety disorder: Secondary | ICD-10-CM

## 2016-10-26 NOTE — Telephone Encounter (Signed)
Please call in xanax with 1 refills 

## 2016-12-21 ENCOUNTER — Ambulatory Visit (INDEPENDENT_AMBULATORY_CARE_PROVIDER_SITE_OTHER): Payer: Medicare Other | Admitting: Nurse Practitioner

## 2016-12-21 ENCOUNTER — Encounter (INDEPENDENT_AMBULATORY_CARE_PROVIDER_SITE_OTHER): Payer: Self-pay

## 2016-12-21 ENCOUNTER — Encounter: Payer: Self-pay | Admitting: Nurse Practitioner

## 2016-12-21 VITALS — BP 130/84 | HR 86 | Temp 97.8°F | Ht 66.0 in | Wt 208.0 lb

## 2016-12-21 DIAGNOSIS — F411 Generalized anxiety disorder: Secondary | ICD-10-CM | POA: Diagnosis not present

## 2016-12-21 DIAGNOSIS — Z6832 Body mass index (BMI) 32.0-32.9, adult: Secondary | ICD-10-CM | POA: Diagnosis not present

## 2016-12-21 DIAGNOSIS — E782 Mixed hyperlipidemia: Secondary | ICD-10-CM | POA: Diagnosis not present

## 2016-12-21 DIAGNOSIS — Z23 Encounter for immunization: Secondary | ICD-10-CM | POA: Diagnosis not present

## 2016-12-21 DIAGNOSIS — F3341 Major depressive disorder, recurrent, in partial remission: Secondary | ICD-10-CM

## 2016-12-21 LAB — CMP14+EGFR
A/G RATIO: 1.4 (ref 1.2–2.2)
ALK PHOS: 111 IU/L (ref 39–117)
ALT: 33 IU/L — AB (ref 0–32)
AST: 20 IU/L (ref 0–40)
Albumin: 4.2 g/dL (ref 3.5–5.5)
BUN/Creatinine Ratio: 16 (ref 9–23)
BUN: 11 mg/dL (ref 6–24)
Bilirubin Total: <0.2 mg/dL (ref 0.0–1.2)
CO2: 24 mmol/L (ref 18–29)
Calcium: 9.7 mg/dL (ref 8.7–10.2)
Chloride: 100 mmol/L (ref 96–106)
Creatinine, Ser: 0.68 mg/dL (ref 0.57–1.00)
GFR calc Af Amer: 117 mL/min/{1.73_m2} (ref >59)
GFR calc non Af Amer: 102 mL/min/{1.73_m2} (ref >59)
GLUCOSE: 104 mg/dL — AB (ref 65–99)
Globulin, Total: 2.9 g/dL (ref 1.5–4.5)
POTASSIUM: 4.2 mmol/L (ref 3.5–5.2)
Sodium: 138 mmol/L (ref 134–144)
Total Protein: 7.1 g/dL (ref 6.0–8.5)

## 2016-12-21 LAB — LIPID PANEL
CHOLESTEROL TOTAL: 157 mg/dL (ref 100–199)
Chol/HDL Ratio: 3.4 ratio units (ref 0.0–4.4)
HDL: 46 mg/dL (ref >39)
LDL Calculated: 83 mg/dL (ref 0–99)
Triglycerides: 141 mg/dL (ref 0–149)
VLDL CHOLESTEROL CAL: 28 mg/dL (ref 5–40)

## 2016-12-21 MED ORDER — ATORVASTATIN CALCIUM 40 MG PO TABS: 40.0000 mg | ORAL_TABLET | Freq: Every day | ORAL | 5 refills | Status: DC

## 2016-12-21 MED ORDER — ALPRAZOLAM 1 MG PO TABS: ORAL_TABLET | ORAL | 2 refills | Status: DC

## 2016-12-21 MED ORDER — VENLAFAXINE HCL ER 150 MG PO CP24: ORAL_CAPSULE | ORAL | 5 refills | Status: DC

## 2016-12-21 NOTE — Progress Notes (Signed)
Subjective:    Patient ID: Brianna Sanchez, female    DOB: [birth year], 51 y.o.   MRN: [id number]  Patient here today for follow up of chronic medical problems. No changes since last visit. No complaints today.  Outpatient Encounter Prescriptions as of 12/21/2016  Medication Sig  . ALPRAZolam (XANAX) 1 MG tablet TAKE 1 TABLET TWICE DAILY AS NEEDED FOR ANXIETY .  Marland Kitchen atorvastatin (LIPITOR) 40 MG tablet Take 1 tablet (40 mg total) by mouth daily.  . Triamcinolone Acetonide (TRIAMCINOLONE 0.1 % CREAM : EUCERIN) CREA Apply 1 application topically 2 (two) times daily.  Marland Kitchen venlafaxine XR (EFFEXOR-XR) 150 MG 24 hr capsule TAKE (2) CAPSULES ONCE DAILY.   No facility-administered encounter medications on file as of 12/21/2016.      Hyperlipidemia  This is a chronic problem. The current episode started more than 1 year ago. The problem is controlled. Recent lipid tests were reviewed and are normal. Exacerbating diseases include obesity. She has no history of diabetes or hypothyroidism. There are no known factors aggravating her hyperlipidemia. Pertinent negatives include no myalgias. Current antihyperlipidemic treatment includes statins. The current treatment provides moderate improvement of lipids. Compliance problems include adherence to exercise and adherence to diet.  Risk factors for coronary artery disease include dyslipidemia and obesity.  Gad/Depression Currently on effexor  and xanax- she is doing okay- still doesn't like getting out of house and doing things- Her marital situation as improved but she stills stays stressed. Depression screen [facility] 2/9 12/21/2016 06/20/2016 03/03/2016 12/17/2015 12/04/2015  Decreased Interest 2 0 _0 Down, Depressed, Hopeless 1 0 _1 PHQ - 2 Score 3 0 _2 Altered sleeping 3 - _3 Tired, decreased energy 2 - _4 Change in appetite 3 - _5 Feeling bad or failure about yourself  2 - _6 Trouble concentrating 2 - _7 Moving slowly or fidgety/restless  1 - _8 Suicidal thoughts 1 - _9 PHQ-9 Score 17 - _10 Difficult doing work/chores Somewhat difficult - - Very difficult -   GAD 7 : Generalized Anxiety Score 12/21/2016  Nervous, Anxious, on Edge 2  Control/stop worrying 2  Worry too much - different things 2  Trouble relaxing 1  Restless 1  Easily annoyed or irritable 1  Afraid - awful might happen 1  Total GAD 7 Score 10  Anxiety Difficulty Somewhat difficult    * Review of Systems  Constitutional: Negative for activity change, appetite change, fatigue, fever and unexpected weight change.  HENT: Negative.   Eyes: Negative.   Respiratory: Negative.   Gastrointestinal: Negative.   Genitourinary: Negative.   Musculoskeletal: Negative.  Negative for myalgias.  Neurological: Negative.   Hematological: Negative.        Objective:   Physical Exam  Constitutional: She is oriented to person, place, and time. She appears well-developed and well-nourished.  HENT:  Nose: Nose normal.  Mouth/Throat: Oropharynx is clear and moist.  Eyes: EOM are normal.  Neck: Trachea normal, normal range of motion and full passive range of motion without pain. Neck supple. No JVD present. Carotid bruit is not present. No thyromegaly present.  Cardiovascular: Normal rate, regular rhythm, normal heart sounds and intact distal pulses.  Exam reveals no gallop and no friction rub.   No murmur heard. Pulmonary/Chest: Effort normal and breath sounds normal.  Abdominal: Soft. Bowel sounds are  normal. She exhibits no distension and no mass. There is no tenderness (mild right upper quadrant pain on palpation).  Musculoskeletal: Normal range of motion.  Lymphadenopathy:    She has no cervical adenopathy.  Neurological: She is alert and oriented to person, place, and time. She has normal reflexes.  Skin: Skin is warm and dry.  Psychiatric: She has a normal mood and affect. Her behavior is normal. Judgment and thought content normal.   BP 130/84    Pulse 86   Temp 97.8 F (36.6 C) (Oral)   Ht 5' 6" (1.676 m)   Wt 208 lb (94.3 kg)   BMI 33.57 kg/m        Assessment & Plan:   1. BMI 32.0-32.9,adult Discussed diet and exercise for person with BMI >25 Will recheck weight in 3-6 months  2. Recurrent major depressive disorder, in partial remission [facility]) Stress management Patient wants to continue current treatment - venlafaxine XR (EFFEXOR-XR) 150 MG 24 hr capsule; TAKE (2) CAPSULES ONCE DAILY.  Dispense: 60 capsule; Refill: 5  3. GAD (generalized anxiety disorder) - ALPRAZolam (XANAX) 1 MG tablet; TAKE 1 TABLET TWICE DAILY AS NEEDED FOR ANXIETY .  Dispense: 60 tablet; Refill: 2  4. Mixed hyperlipidemia Low fat diet - atorvastatin (LIPITOR) 40 MG tablet; Take 1 tablet (40 mg total) by mouth daily.  Dispense: 30 tablet; Refill: 5 - CMP14+EGFR - Lipid panel    Labs pending Health maintenance reviewed Diet and exercise encouraged Continue all meds Follow up  In 6 months   [place], FNP

## 2016-12-21 NOTE — Patient Instructions (Signed)
Stress and Stress Management Stress is a normal reaction to life events. It is what you feel when life demands more than you are used to or more than you can handle. Some stress can be useful. For example, the stress reaction can help you catch the last bus of the day, study for a test, or meet a deadline at work. But stress that occurs too often or for too long can cause problems. It can affect your emotional health and interfere with relationships and normal daily activities. Too much stress can weaken your immune system and increase your risk for physical illness. If you already have a medical problem, stress can make it worse. What are the causes? All sorts of life events may cause stress. An event that causes stress for one person may not be stressful for another person. Major life events commonly cause stress. These may be positive or negative. Examples include losing your job, moving into a new home, getting married, having a baby, or losing a loved one. Less obvious life events may also cause stress, especially if they occur day after day or in combination. Examples include working long hours, driving in traffic, caring for children, being in debt, or being in a difficult relationship. What are the signs or symptoms? Stress may cause emotional symptoms including, the following:  Anxiety. This is feeling worried, afraid, on edge, overwhelmed, or out of control.  Anger. This is feeling irritated or impatient.  Depression. This is feeling sad, down, helpless, or guilty.  Difficulty focusing, remembering, or making decisions. Stress may cause physical symptoms, including the following:  Aches and pains. These may affect your head, neck, back, stomach, or other areas of your body.  Tight muscles or clenched jaw.  Low energy or trouble sleeping. Stress may cause unhealthy behaviors, including the following:  Eating to feel better (overeating) or skipping meals.  Sleeping too little, too  much, or both.  Working too much or putting off tasks (procrastination).  Smoking, drinking alcohol, or using drugs to feel better. How is this diagnosed? Stress is diagnosed through an assessment by your health care provider. Your health care provider will ask questions about your symptoms and any stressful life events.Your health care provider will also ask about your medical history and may order blood tests or other tests. Certain medical conditions and medicine can cause physical symptoms similar to stress. Mental illness can cause emotional symptoms and unhealthy behaviors similar to stress. Your health care provider may refer you to a mental health professional for further evaluation. How is this treated? Stress management is the recommended treatment for stress.The goals of stress management are reducing stressful life events and coping with stress in healthy ways. Techniques for reducing stressful life events include the following:  Stress identification. Self-monitor for stress and identify what causes stress for you. These skills may help you to avoid some stressful events.  Time management. Set your priorities, keep a calendar of events, and learn to say "no." These tools can help you avoid making too many commitments. Techniques for coping with stress include the following:  Rethinking the problem. Try to think realistically about stressful events rather than ignoring them or overreacting. Try to find the positives in a stressful situation rather than focusing on the negatives.  Exercise. Physical exercise can release both physical and emotional tension. The key is to find a form of exercise you enjoy and do it regularly.  Relaxation techniques. These relax the body and mind. Examples include yoga,  meditation, tai chi, biofeedback, deep breathing, progressive muscle relaxation, listening to music, being out in nature, journaling, and other hobbies. Again, the key is to find one or  more that you enjoy and can do regularly.  Healthy lifestyle. Eat a balanced diet, get plenty of sleep, and do not smoke. Avoid using alcohol or drugs to relax.  Strong support network. Spend time with family, friends, or other people you enjoy being around.Express your feelings and talk things over with someone you trust. Counseling or talktherapy with a mental health professional may be helpful if you are having difficulty managing stress on your own. Medicine is typically not recommended for the treatment of stress.Talk to your health care provider if you think you need medicine for symptoms of stress. Follow these instructions at home:  Keep all follow-up visits as directed by your health care provider.  Take all medicines as directed by your health care provider. Contact a health care provider if:  Your symptoms get worse or you start having new symptoms.  You feel overwhelmed by your problems and can no longer manage them on your own. Get help right away if:  You feel like hurting yourself or someone else. This information is not intended to replace advice given to you by your health care provider. Make sure you discuss any questions you have with your health care provider. Document Released: 05/31/2001 Document Revised: 05/12/2016 Document Reviewed: 07/30/2013 Elsevier Interactive Patient Education  2017 [employer].

## 2017-02-07 ENCOUNTER — Telehealth: Payer: Self-pay | Admitting: Nurse Practitioner

## 2017-02-07 NOTE — Telephone Encounter (Signed)
Pt doesn't want to schedule until July will call pt back once nurse schedule is open for then

## 2017-03-24 ENCOUNTER — Other Ambulatory Visit: Payer: Self-pay | Admitting: Nurse Practitioner

## 2017-03-24 DIAGNOSIS — F411 Generalized anxiety disorder: Secondary | ICD-10-CM

## 2017-03-24 NOTE — Telephone Encounter (Signed)
Last filled 02/23/17, last seen 12/21/16. Call in

## 2017-03-27 NOTE — Telephone Encounter (Signed)
Please call in xanax with 1 refills 

## 2017-03-28 NOTE — Telephone Encounter (Signed)
Refill called to [facility] 

## 2017-05-24 ENCOUNTER — Other Ambulatory Visit: Payer: Self-pay | Admitting: Nurse Practitioner

## 2017-05-24 DIAGNOSIS — F411 Generalized anxiety disorder: Secondary | ICD-10-CM

## 2017-05-25 NOTE — Telephone Encounter (Signed)
Please call in alprazolam with 0 refills Last refill without being seen  

## 2017-05-25 NOTE — Telephone Encounter (Signed)
Rx called in to pharmacy. 

## 2017-05-25 NOTE — Telephone Encounter (Signed)
Last filled 04/24/17, last seen 12/21/16. Call in

## 2017-05-29 DIAGNOSIS — Z1231 Encounter for screening mammogram for malignant neoplasm of breast: Secondary | ICD-10-CM | POA: Diagnosis not present

## 2017-06-19 ENCOUNTER — Encounter: Payer: Self-pay | Admitting: Nurse Practitioner

## 2017-06-19 ENCOUNTER — Ambulatory Visit (INDEPENDENT_AMBULATORY_CARE_PROVIDER_SITE_OTHER): Payer: Medicare Other | Admitting: Nurse Practitioner

## 2017-06-19 VITALS — BP 130/82 | HR 78 | Temp 96.5°F | Ht 66.0 in | Wt 202.0 lb

## 2017-06-19 DIAGNOSIS — F411 Generalized anxiety disorder: Secondary | ICD-10-CM | POA: Diagnosis not present

## 2017-06-19 DIAGNOSIS — Z6832 Body mass index (BMI) 32.0-32.9, adult: Secondary | ICD-10-CM

## 2017-06-19 DIAGNOSIS — E782 Mixed hyperlipidemia: Secondary | ICD-10-CM

## 2017-06-19 DIAGNOSIS — F3341 Major depressive disorder, recurrent, in partial remission: Secondary | ICD-10-CM | POA: Diagnosis not present

## 2017-06-19 MED ORDER — VENLAFAXINE HCL ER 150 MG PO CP24: ORAL_CAPSULE | ORAL | 5 refills | Status: DC

## 2017-06-19 MED ORDER — ALPRAZOLAM 1 MG PO TABS: ORAL_TABLET | ORAL | 2 refills | Status: DC

## 2017-06-19 MED ORDER — ATORVASTATIN CALCIUM 40 MG PO TABS: 40.0000 mg | ORAL_TABLET | Freq: Every day | ORAL | 5 refills | Status: DC

## 2017-06-19 NOTE — Progress Notes (Signed)
Subjective:    Patient ID: Brianna Sanchez, female    DOB: 01/11/1966, 51 y.o.   MRN: [id number]  HPI  Brianna Sanchez is here today for follow up of chronic medical problem.  Outpatient Encounter Prescriptions as of 06/19/2017  Medication Sig  . ALPRAZolam (XANAX) 1 MG tablet TAKE 1 TABLET TWICE DAILY AS NEEDED FOR ANXIETY .  Marland Kitchen atorvastatin (LIPITOR) 40 MG tablet Take 1 tablet (40 mg total) by mouth daily.  . Triamcinolone Acetonide (TRIAMCINOLONE 0.1 % CREAM : EUCERIN) CREA Apply 1 application topically 2 (two) times daily.  Marland Kitchen venlafaxine XR (EFFEXOR-XR) 150 MG 24 hr capsule TAKE (2) CAPSULES ONCE DAILY.   No facility-administered encounter medications on file as of 06/19/2017.     1. Mixed hyperlipidemia  Patient does not watch diet at home. She is on lipitor without side effects  2. GAD (generalized anxiety disorder)  Patient is always stressed. Even when she takes meds she is stressed. Take xanax 2x a day. GAD 7 : Generalized Anxiety Score 06/19/2017 12/21/2016  Nervous, Anxious, on Edge 2 2  Control/stop worrying 3 2  Worry too much - different things 3 2  Trouble relaxing 1 1  Restless 1 1  Easily annoyed or irritable 2 1  Afraid - awful might happen 2 1  Total GAD 7 Score 14 10  Anxiety Difficulty Somewhat difficult Somewhat difficult      3. Recurrent major depressive disorder, in partial remission ([name]) Currently on effexor which she says works well for her.   4. BMI 32.0-32.9,adult  She has lost 6 lbs since last visit.    New complaints: None today     Review of Systems  Constitutional: Negative for activity change and appetite change.  HENT: Negative.   Eyes: Negative for pain.  Respiratory: Negative for shortness of breath.   Cardiovascular: Negative for chest pain, palpitations and leg swelling.  Gastrointestinal: Negative for abdominal pain.  Endocrine: Negative for polydipsia.  Genitourinary: Negative.   Skin: Negative for rash.  Neurological:  Negative for dizziness, weakness and headaches.  Hematological: Does not bruise/bleed easily.  Psychiatric/Behavioral: Negative.   All other systems reviewed and are negative.      Objective:   Physical Exam  Constitutional: She is oriented to person, place, and time. She appears well-developed and well-nourished.  HENT:  Nose: Nose normal.  Mouth/Throat: Oropharynx is clear and moist.  Eyes: EOM are normal.  Neck: Trachea normal, normal range of motion and full passive range of motion without pain. Neck supple. No JVD present. Carotid bruit is not present. No thyromegaly present.  Cardiovascular: Normal rate, regular rhythm, normal heart sounds and intact distal pulses.  Exam reveals no gallop and no friction rub.   No murmur heard. Pulmonary/Chest: Effort normal and breath sounds normal.  Abdominal: Soft. Bowel sounds are normal. She exhibits no distension and no mass. There is no tenderness.  Musculoskeletal: Normal range of motion.  Lymphadenopathy:    She has no cervical adenopathy.  Neurological: She is alert and oriented to person, place, and time. She has normal reflexes.  Skin: Skin is warm and dry.  Psychiatric: She has a normal mood and affect. Her behavior is normal. Judgment and thought content normal.      BP 130/82   Pulse 78   Temp (!) 96.5 F (35.8 C) (Oral)   Ht _0  (1.676 m)   Wt 202 lb (91.6 kg)   BMI 32.60 kg/m      Assessment &  Plan:  1. Mixed hyperlipidemia Low fat diet - atorvastatin (LIPITOR) 40 MG tablet; Take 1 tablet (40 mg total) by mouth daily.  Dispense: 30 tablet; Refill: 5 - CMP14+EGFR - Lipid panel  2. GAD (generalized anxiety disorder) Stress management - ALPRAZolam (XANAX) 1 MG tablet; TAKE 1 TABLET TWICE DAILY AS NEEDED FOR ANXIETY .  Dispense: 60 tablet; Refill: 2  3. Recurrent major depressive disorder, in partial remission (HCC) - venlafaxine XR (EFFEXOR-XR) 150 MG 24 hr capsule; TAKE (2) CAPSULES ONCE DAILY.  Dispense: 60  capsule; Refill: 5  4. BMI 32.0-32.9,adult Discussed diet and exercise for person with BMI >25 Will recheck weight in 3-6 months    Labs pending Health maintenance reviewed Diet and exercise encouraged Continue all meds Follow up  In 6 months   [place], FNP

## 2017-06-19 NOTE — Patient Instructions (Signed)
Cholesterol Cholesterol is a fat. Your body needs a small amount of cholesterol. Cholesterol (plaque) may build up in your blood vessels (arteries). That makes you more likely to have a heart attack or stroke. You cannot feel your cholesterol level. Having a blood test is the only way to find out if your level is high. Keep your test results. Work with your doctor to keep your cholesterol at a good level. What do the results mean?  Total cholesterol is how much cholesterol is in your blood.  LDL is bad cholesterol. This is the type that can build up. Try to have low LDL.  HDL is good cholesterol. It cleans your blood vessels and carries LDL away. Try to have high HDL.  Triglycerides are fat that the body can store or burn for energy. What are good levels of cholesterol?  Total cholesterol below 200.  LDL below 100 is good for people who have health risks. LDL below 70 is good for people who have very high risks.  HDL above 40 is good. It is best to have HDL of 60 or higher.  Triglycerides below 150. How can I lower my cholesterol? Diet  Follow your diet program as told by your doctor.  Choose fish, white meat chicken, or turkey that is roasted or baked. Try not to eat red meat, fried foods, sausage, or lunch meats.  Eat lots of fresh fruits and vegetables.  Choose whole grains, beans, pasta, potatoes, and cereals.  Choose olive oil, corn oil, or canola oil. Only use small amounts.  Try not to eat butter, mayonnaise, shortening, or palm kernel oils.  Try not to eat foods with trans fats.  Choose low-fat or nonfat dairy foods.  Drink skim or nonfat milk.  Eat low-fat or nonfat yogurt and cheeses.  Try not to drink whole milk or cream.  Try not to eat ice cream, egg yolks, or full-fat cheeses.  Healthy desserts include angel food cake, ginger snaps, animal crackers, hard candy, popsicles, and low-fat or nonfat frozen yogurt. Try not to eat pastries, cakes, pies, and  cookies. Exercise  Follow your exercise program as told by your doctor.  Be more active. Try gardening, walking, and taking the stairs.  Ask your doctor about ways that you can be more active. Medicine  Take over-the-counter and prescription medicines only as told by your doctor. This information is not intended to replace advice given to you by your health care provider. Make sure you discuss any questions you have with your health care provider. Document Released: 03/03/2009 Document Revised: 07/06/2016 Document Reviewed: 06/16/2016 Elsevier Interactive Patient Education  2017 Elsevier Inc.  

## 2017-06-20 LAB — CMP14+EGFR
A/G RATIO: 1.7 (ref 1.2–2.2)
ALBUMIN: 4.5 g/dL (ref 3.5–5.5)
ALT: 29 IU/L (ref 0–32)
AST: 19 IU/L (ref 0–40)
Alkaline Phosphatase: 109 IU/L (ref 39–117)
BUN / CREAT RATIO: 24 — AB (ref 9–23)
BUN: 16 mg/dL (ref 6–24)
Bilirubin Total: 0.2 mg/dL (ref 0.0–1.2)
CALCIUM: 9.8 mg/dL (ref 8.7–10.2)
CO2: 24 mmol/L (ref 20–29)
Chloride: 103 mmol/L (ref 96–106)
Creatinine, Ser: 0.67 mg/dL (ref 0.57–1.00)
GFR, EST AFRICAN AMERICAN: 117 mL/min/{1.73_m2} (ref >59)
GFR, EST NON AFRICAN AMERICAN: 101 mL/min/{1.73_m2} (ref >59)
GLOBULIN, TOTAL: 2.6 g/dL (ref 1.5–4.5)
Glucose: 117 mg/dL — ABNORMAL HIGH (ref 65–99)
POTASSIUM: 5.1 mmol/L (ref 3.5–5.2)
SODIUM: 141 mmol/L (ref 134–144)
TOTAL PROTEIN: 7.1 g/dL (ref 6.0–8.5)

## 2017-06-20 LAB — LIPID PANEL
CHOL/HDL RATIO: 3.1 ratio (ref 0.0–4.4)
Cholesterol, Total: 138 mg/dL (ref 100–199)
HDL: 45 mg/dL (ref >39)
LDL CALC: 71 mg/dL (ref 0–99)
Triglycerides: 109 mg/dL (ref 0–149)
VLDL Cholesterol Cal: 22 mg/dL (ref 5–40)

## 2017-09-26 ENCOUNTER — Other Ambulatory Visit: Payer: Self-pay | Admitting: Nurse Practitioner

## 2017-09-26 DIAGNOSIS — F411 Generalized anxiety disorder: Secondary | ICD-10-CM

## 2017-09-27 NOTE — Telephone Encounter (Signed)
Please call in alprazolam with 1 refills 

## 2017-09-27 NOTE — Telephone Encounter (Signed)
Refill called to [facility]

## 2017-11-10 DIAGNOSIS — Z23 Encounter for immunization: Secondary | ICD-10-CM | POA: Diagnosis not present

## 2017-12-01 ENCOUNTER — Other Ambulatory Visit: Payer: Self-pay | Admitting: Nurse Practitioner

## 2017-12-01 DIAGNOSIS — F411 Generalized anxiety disorder: Secondary | ICD-10-CM

## 2017-12-01 DIAGNOSIS — F3341 Major depressive disorder, recurrent, in partial remission: Secondary | ICD-10-CM

## 2017-12-04 NOTE — Telephone Encounter (Signed)
Please call in alprazolam with 1 refills 

## 2017-12-04 NOTE — Telephone Encounter (Signed)
Xanax refill called to [facility] pharmacy.

## 2017-12-04 NOTE — Telephone Encounter (Signed)
Last seen 7/18  MMM If approved route to nurse to call into [facility]  [phone]

## 2017-12-21 ENCOUNTER — Ambulatory Visit (INDEPENDENT_AMBULATORY_CARE_PROVIDER_SITE_OTHER): Payer: Medicare Other | Admitting: Nurse Practitioner

## 2017-12-21 ENCOUNTER — Encounter: Payer: Self-pay | Admitting: Nurse Practitioner

## 2017-12-21 VITALS — BP 119/82 | HR 69 | Temp 97.2°F | Ht 66.0 in | Wt 202.0 lb

## 2017-12-21 DIAGNOSIS — F411 Generalized anxiety disorder: Secondary | ICD-10-CM

## 2017-12-21 DIAGNOSIS — J0100 Acute maxillary sinusitis, unspecified: Secondary | ICD-10-CM

## 2017-12-21 DIAGNOSIS — F3341 Major depressive disorder, recurrent, in partial remission: Secondary | ICD-10-CM

## 2017-12-21 DIAGNOSIS — Z6832 Body mass index (BMI) 32.0-32.9, adult: Secondary | ICD-10-CM

## 2017-12-21 DIAGNOSIS — E782 Mixed hyperlipidemia: Secondary | ICD-10-CM | POA: Diagnosis not present

## 2017-12-21 MED ORDER — AZITHROMYCIN 250 MG PO TABS: ORAL_TABLET | ORAL | 0 refills | Status: DC

## 2017-12-21 MED ORDER — ATORVASTATIN CALCIUM 40 MG PO TABS: 40.0000 mg | ORAL_TABLET | Freq: Every day | ORAL | 5 refills | Status: DC

## 2017-12-21 MED ORDER — VENLAFAXINE HCL ER 150 MG PO CP24: ORAL_CAPSULE | ORAL | 5 refills | Status: DC

## 2017-12-21 MED ORDER — ALPRAZOLAM 1 MG PO TABS: ORAL_TABLET | ORAL | 2 refills | Status: DC

## 2017-12-21 NOTE — Progress Notes (Addendum)
Subjective:    Patient ID: Brianna Sanchez, female    DOB: 30-Sep-1966, 52 y.o.   MRN: [id number]  HPI  ROSSY VIRAG is here today for follow up of chronic medical problem.  Outpatient Encounter Medications as of 12/21/2017  Medication Sig  . ALPRAZolam (XANAX) 1 MG tablet TAKE 1 TABLET TWICE DAILY AS NEEDED FOR ANXIETY .  Marland Kitchen atorvastatin (LIPITOR) 40 MG tablet Take 1 tablet (40 mg total) by mouth daily.  . Triamcinolone Acetonide (TRIAMCINOLONE 0.1 % CREAM : EUCERIN) CREA Apply 1 application topically 2 (two) times daily.  Marland Kitchen venlafaxine XR (EFFEXOR-XR) 150 MG 24 hr capsule TAKE (2) CAPSULES ONCE DAILY.     1. Mixed hyperlipidemia  Does not watch diet at all. No exercise  2. GAD (generalized anxiety disorder)  On xanax daily and cannot go without it.  GAD 7 : Generalized Anxiety Score 12/21/2017 06/19/2017 12/21/2016  Nervous, Anxious, on Edge _0 Control/stop worrying _1 Worry too much - different things _2 Trouble relaxing _3 Restless _4 Easily annoyed or irritable _5 Afraid - awful might happen _6 Total GAD 7 Score _7 Anxiety Difficulty Somewhat difficult Somewhat difficult Somewhat difficult      3. Recurrent major depressive disorder, in partial remission ([place])  On effexor and is doing well for now Depression screen [facility] 2/9 12/21/2017 06/19/2017 12/21/2016  Decreased Interest _8 Down, Depressed, Hopeless _9 PHQ - 2 Score _10 Altered sleeping _11 Tired, decreased energy _12 Change in appetite _13 Feeling bad or failure about yourself  _14 Trouble concentrating _15 Moving slowly or fidgety/restless _16 Suicidal thoughts _17 PHQ-9 Score _18 Difficult doing work/chores - - Somewhat difficult  Some recent data might be hidden     4. BMI 32.0-32.9,adult  No recent weight chnages    New complaints: Sinus congestion, headaches and facial pressure for greater then a week. Started gettting a bad taste in  moth yesterday.  Social history: Lives with husband , daughter and son and grandchildren    Review of Systems  Constitutional: Negative for activity change and appetite change.  HENT: Negative.   Eyes: Negative for pain.  Respiratory: Negative for shortness of breath.   Cardiovascular: Negative for chest pain, palpitations and leg swelling.  Gastrointestinal: Negative for abdominal pain.  Endocrine: Negative for polydipsia.  Genitourinary: Negative.   Skin: Negative for rash.  Neurological: Negative for dizziness, weakness and headaches.  Hematological: Does not bruise/bleed easily.  Psychiatric/Behavioral: Negative.   All other systems reviewed and are negative.      Objective:   Physical Exam  Constitutional: She is oriented to person, place, and time. She appears well-developed and well-nourished.  HENT:  Right Ear: Hearing, tympanic membrane, external ear and ear canal normal.  Left Ear: Hearing, tympanic membrane, external ear and ear canal normal.  Nose: Mucosal edema and rhinorrhea present. Right sinus exhibits maxillary sinus tenderness. Left sinus exhibits maxillary sinus tenderness.  Mouth/Throat: Uvula is midline and oropharynx is clear and moist.  Eyes: EOM are normal.  Neck: Trachea normal, normal range of motion and full passive range of motion without pain. Neck supple. No JVD present. Carotid bruit is not present. No thyromegaly present.  Cardiovascular: Normal rate, regular rhythm, normal heart sounds and intact distal pulses. Exam reveals no gallop and no friction rub.  No murmur heard. Pulmonary/Chest: Effort normal and breath sounds normal.  Abdominal: Soft. Bowel sounds are normal. She exhibits no distension and no mass. There is no tenderness.  Musculoskeletal: Normal range of motion.  Lymphadenopathy:    She has no cervical adenopathy.  Neurological: She is alert and oriented to person, place, and time. She has normal reflexes.  Skin: Skin is warm and  dry.  Psychiatric: She has a normal mood and affect. Her behavior is normal. Judgment and thought content normal.   BP 119/82   Pulse 69   Temp (!) 97.2 F (36.2 C) (Oral)   Ht _0  (1.676 m)   Wt 202 lb (91.6 kg)   BMI 32.60 kg/m        Assessment & Plan:  1. Mixed hyperlipidemia Low fat diet  - atorvastatin (LIPITOR) 40 MG tablet; Take 1 tablet (40 mg total) by mouth daily.  Dispense: 30 tablet; Refill: 5 - CMP14+EGFR - Lipid panel  2. GAD (generalized anxiety disorder) Stress management - ALPRAZolam (XANAX) 1 MG tablet; TAKE 1 TABLET TWICE DAILY AS NEEDED FOR ANXIETY .  Dispense: 60 tablet; Refill: 2  3. Recurrent major depressive disorder, in partial remission (HCC) - venlafaxine XR (EFFEXOR-XR) 150 MG 24 hr capsule; TAKE (2) CAPSULES ONCE DAILY.  Dispense: 60 capsule; Refill: 5  4. BMI 32.0-32.9,adult Discussed diet and exercise for person with BMI >25 Will recheck weight in 3-6 months  5. Acute maxillary sinusitis, recurrence not specified 1. Take meds as prescribed 2. Use a cool mist humidifier especially during the winter months and when heat has been humid. 3. Use saline nose sprays frequently 4. Saline irrigations of the nose can be very helpful if done frequently.  * 4X daily for 1 week*  * Use of a nettie pot can be helpful with this. Follow directions with this* 5. Drink plenty of fluids 6. Keep thermostat turn down low 7.For any cough or congestion  Use plain Mucinex- regular strength or max strength is fine   * Children- consult with Pharmacist for dosing 8. For fever or aces or pains- take tylenol or ibuprofen appropriate for age and weight.  * for fevers greater than 101 orally you may alternate ibuprofen and tylenol every  3 hours.   - azithromycin (ZITHROMAX Z-PAK) 250 MG tablet; As directed  Dispense: 6 tablet; Refill: 0    Labs pending Health maintenance reviewed Diet and exercise encouraged Continue all meds Follow up  In 3 months    [place], FNP

## 2017-12-21 NOTE — Patient Instructions (Signed)

## 2017-12-22 LAB — CMP14+EGFR
A/G RATIO: 1.5 (ref 1.2–2.2)
ALK PHOS: 107 IU/L (ref 39–117)
ALT: 23 IU/L (ref 0–32)
AST: 15 IU/L (ref 0–40)
Albumin: 4.3 g/dL (ref 3.5–5.5)
BUN/Creatinine Ratio: 17 (ref 9–23)
BUN: 11 mg/dL (ref 6–24)
Bilirubin Total: 0.2 mg/dL (ref 0.0–1.2)
CO2: 21 mmol/L (ref 20–29)
Calcium: 9.6 mg/dL (ref 8.7–10.2)
Chloride: 102 mmol/L (ref 96–106)
Creatinine, Ser: 0.63 mg/dL (ref 0.57–1.00)
GFR calc Af Amer: 119 mL/min/{1.73_m2} (ref >59)
GFR calc non Af Amer: 103 mL/min/{1.73_m2} (ref >59)
GLOBULIN, TOTAL: 2.9 g/dL (ref 1.5–4.5)
Glucose: 86 mg/dL (ref 65–99)
POTASSIUM: 5 mmol/L (ref 3.5–5.2)
SODIUM: 141 mmol/L (ref 134–144)
Total Protein: 7.2 g/dL (ref 6.0–8.5)

## 2017-12-22 LAB — LIPID PANEL
CHOL/HDL RATIO: 3.1 ratio (ref 0.0–4.4)
CHOLESTEROL TOTAL: 144 mg/dL (ref 100–199)
HDL: 47 mg/dL (ref >39)
LDL Calculated: 67 mg/dL (ref 0–99)
TRIGLYCERIDES: 148 mg/dL (ref 0–149)
VLDL Cholesterol Cal: 30 mg/dL (ref 5–40)

## 2018-03-30 ENCOUNTER — Other Ambulatory Visit: Payer: Self-pay | Admitting: Nurse Practitioner

## 2018-03-30 DIAGNOSIS — F411 Generalized anxiety disorder: Secondary | ICD-10-CM

## 2018-03-30 NOTE — Telephone Encounter (Signed)
Last seen 12/21/17

## 2018-05-21 ENCOUNTER — Other Ambulatory Visit: Payer: Self-pay | Admitting: Nurse Practitioner

## 2018-05-21 DIAGNOSIS — F411 Generalized anxiety disorder: Secondary | ICD-10-CM

## 2018-06-25 ENCOUNTER — Ambulatory Visit (INDEPENDENT_AMBULATORY_CARE_PROVIDER_SITE_OTHER): Payer: Medicare Other | Admitting: Nurse Practitioner

## 2018-06-25 ENCOUNTER — Encounter: Payer: Self-pay | Admitting: Nurse Practitioner

## 2018-06-25 VITALS — BP 126/85 | HR 72 | Temp 97.6°F | Ht 66.0 in | Wt 191.0 lb

## 2018-06-25 DIAGNOSIS — E782 Mixed hyperlipidemia: Secondary | ICD-10-CM | POA: Diagnosis not present

## 2018-06-25 DIAGNOSIS — F411 Generalized anxiety disorder: Secondary | ICD-10-CM | POA: Diagnosis not present

## 2018-06-25 DIAGNOSIS — F3341 Major depressive disorder, recurrent, in partial remission: Secondary | ICD-10-CM | POA: Diagnosis not present

## 2018-06-25 DIAGNOSIS — Z6832 Body mass index (BMI) 32.0-32.9, adult: Secondary | ICD-10-CM

## 2018-06-25 LAB — CMP14+EGFR
A/G RATIO: 1.7 (ref 1.2–2.2)
ALBUMIN: 4.3 g/dL (ref 3.5–5.5)
ALK PHOS: 114 IU/L (ref 39–117)
ALT: 16 IU/L (ref 0–32)
AST: 12 IU/L (ref 0–40)
BUN / CREAT RATIO: 16 (ref 9–23)
BUN: 11 mg/dL (ref 6–24)
Bilirubin Total: <0.2 mg/dL (ref 0.0–1.2)
CO2: 22 mmol/L (ref 20–29)
Calcium: 9.6 mg/dL (ref 8.7–10.2)
Chloride: 103 mmol/L (ref 96–106)
Creatinine, Ser: 0.7 mg/dL (ref 0.57–1.00)
GFR calc Af Amer: 114 mL/min/{1.73_m2} (ref >59)
GFR, EST NON AFRICAN AMERICAN: 99 mL/min/{1.73_m2} (ref >59)
GLOBULIN, TOTAL: 2.5 g/dL (ref 1.5–4.5)
Glucose: 95 mg/dL (ref 65–99)
Potassium: 5.1 mmol/L (ref 3.5–5.2)
SODIUM: 140 mmol/L (ref 134–144)
Total Protein: 6.8 g/dL (ref 6.0–8.5)

## 2018-06-25 LAB — LIPID PANEL
CHOL/HDL RATIO: 2.8 ratio (ref 0.0–4.4)
CHOLESTEROL TOTAL: 123 mg/dL (ref 100–199)
HDL: 44 mg/dL (ref >39)
LDL CALC: 60 mg/dL (ref 0–99)
Triglycerides: 96 mg/dL (ref 0–149)
VLDL Cholesterol Cal: 19 mg/dL (ref 5–40)

## 2018-06-25 MED ORDER — ATORVASTATIN CALCIUM 40 MG PO TABS: 40.0000 mg | ORAL_TABLET | Freq: Every day | ORAL | 5 refills | Status: DC

## 2018-06-25 MED ORDER — VENLAFAXINE HCL ER 150 MG PO CP24: ORAL_CAPSULE | ORAL | 5 refills | Status: DC

## 2018-06-25 MED ORDER — ALPRAZOLAM 1 MG PO TABS
ORAL_TABLET | ORAL | 3 refills | Status: DC
Start: 2018-06-25 — End: 2018-10-26

## 2018-06-25 NOTE — Patient Instructions (Signed)
Living With Post-Traumatic Stress Disorder If you have been diagnosed with post-traumatic stress disorder (PTSD), you may be relieved that you now know why you have felt or behaved a certain way. Still, you may feel overwhelmed about the treatment ahead. You may also wonder how to get the support you need and how to deal with the condition day-to-day. If you are living with PTSD, there are ways to help you recover from it and manage your symptoms. How to manage lifestyle changes Managing stress Stress is your body's reaction to life changes and events, both good and bad. Stress can make PTSD worse. Take the following steps to cope with stress:  Talk with your health care provider or a counselor if you would like to learn more about techniques to reduce your stress. He or she may suggest some stress reduction techniques such as: ? Muscle relaxation exercises. ? Regular exercise. ? Meditation, yoga, or other mind-body exercises. ? Breathing exercises. ? Listening to quiet music. ? Spending time outside.  Maintain a healthy lifestyle. Eat a healthy diet, exercise regularly, get plenty of sleep, and take time to relax.  Spend time with others. Talk with them about how you are feeling and what kind of support you need. Try to not isolate yourself, even though you may feel like doing that. Isolating yourself can delay your recovery.  Do activities and hobbies that you enjoy.  Pace yourself when doing stressful things. Take breaks, and reward yourself when you finish. Make sure that you do not overload your schedule.  Medicines Your health care provider may suggest certain medicines if he or she feels that they will help to improve your condition. Antidepressants or antipsychotic medicines may be used to treat PTSD. Avoid using alcohol and other substances that may prevent your medicines from working properly (may interact). It is also important to:  Talk with your pharmacist or health care  provider about all medicines that you take, their possible side effects, and which medicines are safe to take together.  Make it your goal to take part in all treatment decisions (shared decision-making). Ask about possible side effects of medicines that your health care provider recommends, and tell him or her how you feel about having those side effects. It is best if shared decision-making with your health care provider is part of your total treatment plan.  If your health care provider prescribes a medicine, you may not notice the full benefits of it for 4-8 weeks. Most people who are treated for PTSD need to take medicine for at least 6-12 months after they feel better. If you are taking medicines as part of your treatment, do not stop taking medicines before you ask your health care provider if it is safe to stop. You may need to have the medicine slowly decreased (tapered) over time to lower the risk of harmful side effects. Relationships Many people who have PTSD have difficulty trusting others. Make an effort to:  Take risks and develop trust with close friends and family members. Developing trust in others can help you feel safe and connect you with emotional support.  Be open and honest about your feelings.  Try to have fun and relax in safe spaces, such as with friends and family.  Think about going to couples counseling, family education classes, or family therapy. Your loved ones may not always know how to be supportive. Therapy can be helpful for everyone.  How to recognize changes in your condition Be aware of your   symptoms and how often you have them. The following symptoms mean that you need to seek help for your PTSD:  You feel suspicious and angry.  You have repeated flashbacks.  You avoid going out or being with others.  You have an increasing number of fights with close friends or family members, such as your spouse.  You have thoughts about hurting yourself or  others.  You cannot get relief from feelings of depression or anxiety.  Where to find support Talking to others  Explain that PTSD is a mental health problem. It is something that a person can develop after experiencing or seeing a life-threatening event. Tell them that PTSD makes you feel stress like you did during the event.  Talk to your loved ones about the symptoms you have. Also tell them what things or situations can cause symptoms to start (are triggers for you).  Assure your loved ones that there are treatments to help PTSD. Discuss possibly seeking family therapy or couples therapy.  If you are worried or fearful about seeking treatment, ask for support. Finances Not all insurance plans cover mental health care, so it is important to check with your insurance carrier. If paying for co-pays or counseling services is a problem, search for a local or county mental health care center. Public mental health care services may be offered there at a low cost or no cost when you are not able to see a private health care provider. If you are a veteran, contact a local veterans organization or veterans hospital for more information. If you are taking medicine for PTSD, you may be able to get the genericform, which may be less expensive than brand-name medicine. Some makers of prescription medicines also offer help to patients who cannot afford the medicines that they need. Community resources  Find a support group in your community. Often, groups are available for military veterans, trauma victims, and family members or caregivers.  Look into volunteer opportunities. Taking part in these can help you feel more connected to your community.  Contact a local organization to find out if you are eligible for a service dog.  Keep daily contact with at least one trusted friend or family member. Follow these instructions at home: Lifestyle  Exercise regularly. Try to do 30 or more minutes of  physical activity on most days of the week.  Try to get 7-9 hours of sleep each night. To help with sleep: ? Keep your bedroom cool and dark. ? Do not eat a heavy meal during the hour before you go to bed. ? Do not drink alcohol or caffeinated drinks before bed. ? Avoid screen time before bedtime. This means avoiding use of your TV, computer, tablet, and cell phone.  Avoid using alcohol or drugs.  Practice self-soothing skills and use them daily.  Try to have fun and seek humor in your life. General instructions  If your PTSD is affecting your marriage or family, seek help from a family therapist.  Take over-the-counter and prescription medicines only as told by your health care provider.  Make sure to let all of your health care providers know that you have PTSD. This is especially important if you are having surgery or need to be admitted to the hospital.  Keep all follow-up visits as told by your health care providers. This is important. Where to find more information: Go to this website to find more information about PTSD, treatment for PTSD, and how to get support:  National   Center for PTSD: www.ptsd.va.gov  Contact a health care provider if:  Your symptoms get worse or they do not get better. Get help right away if:  You have thoughts about hurting yourself or others. If you ever feel like you may hurt yourself or others, or have thoughts about taking your own life, get help right away. You can go to your nearest emergency department or call:  Your local emergency services (911 in the [place]  A suicide crisis helpline, such as the National Suicide Prevention Lifeline at 1-800-273-8255. This is open 24-hours a day.  Summary  If you are living with PTSD, there are ways to help you recover from it and manage your symptoms.  Find supportive environments and people who understand PTSD. Spend time in those places, and maintain contact with those people.  Work with your  health care team to create a management plan that includes counseling, stress management techniques, and healthy lifestyle habits. This information is not intended to replace advice given to you by your health care provider. Make sure you discuss any questions you have with your health care provider. Document Released: 04/06/2017 Document Revised: 04/06/2017 Document Reviewed: 04/06/2017 Elsevier Interactive Patient Education  2018 [employer] Inc.  
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
## 2018-08-02 ENCOUNTER — Encounter: Payer: Medicare Other | Admitting: *Deleted

## 2018-10-26 ENCOUNTER — Encounter: Payer: Self-pay | Admitting: Nurse Practitioner

## 2018-10-26 ENCOUNTER — Ambulatory Visit (INDEPENDENT_AMBULATORY_CARE_PROVIDER_SITE_OTHER): Payer: Medicare Other | Admitting: Nurse Practitioner

## 2018-10-26 VITALS — BP 114/76 | HR 73 | Temp 98.4°F | Ht 66.0 in | Wt 195.0 lb

## 2018-10-26 DIAGNOSIS — F411 Generalized anxiety disorder: Secondary | ICD-10-CM | POA: Diagnosis not present

## 2018-10-26 DIAGNOSIS — E782 Mixed hyperlipidemia: Secondary | ICD-10-CM

## 2018-10-26 DIAGNOSIS — F3341 Major depressive disorder, recurrent, in partial remission: Secondary | ICD-10-CM | POA: Diagnosis not present

## 2018-10-26 DIAGNOSIS — Z6832 Body mass index (BMI) 32.0-32.9, adult: Secondary | ICD-10-CM | POA: Diagnosis not present

## 2018-10-26 DIAGNOSIS — Z23 Encounter for immunization: Secondary | ICD-10-CM

## 2018-10-26 MED ORDER — VENLAFAXINE HCL ER 150 MG PO CP24: ORAL_CAPSULE | ORAL | 5 refills | Status: DC

## 2018-10-26 MED ORDER — ALPRAZOLAM 1 MG PO TABS: ORAL_TABLET | ORAL | 3 refills | Status: DC

## 2018-10-26 MED ORDER — ATORVASTATIN CALCIUM 40 MG PO TABS: 40.0000 mg | ORAL_TABLET | Freq: Every day | ORAL | 5 refills | Status: DC
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
## 2018-10-26 NOTE — Patient Instructions (Signed)
Living With Post-Traumatic Stress Disorder If you have been diagnosed with post-traumatic stress disorder (PTSD), you may be relieved that you now know why you have felt or behaved a certain way. Still, you may feel overwhelmed about the treatment ahead. You may also wonder how to get the support you need and how to deal with the condition day-to-day. If you are living with PTSD, there are ways to help you recover from it and manage your symptoms. How to manage lifestyle changes Managing stress Stress is your body's reaction to life changes and events, both good and bad. Stress can make PTSD worse. Take the following steps to cope with stress:  Talk with your health care provider or a counselor if you would like to learn more about techniques to reduce your stress. He or she may suggest some stress reduction techniques such as: ? Muscle relaxation exercises. ? Regular exercise. ? Meditation, yoga, or other mind-body exercises. ? Breathing exercises. ? Listening to quiet music. ? Spending time outside.  Maintain a healthy lifestyle. Eat a healthy diet, exercise regularly, get plenty of sleep, and take time to relax.  Spend time with others. Talk with them about how you are feeling and what kind of support you need. Try to not isolate yourself, even though you may feel like doing that. Isolating yourself can delay your recovery.  Do activities and hobbies that you enjoy.  Pace yourself when doing stressful things. Take breaks, and reward yourself when you finish. Make sure that you do not overload your schedule.  Medicines Your health care provider may suggest certain medicines if he or she feels that they will help to improve your condition. Antidepressants or antipsychotic medicines may be used to treat PTSD. Avoid using alcohol and other substances that may prevent your medicines from working properly (may interact). It is also important to:  Talk with your pharmacist or health care  provider about all medicines that you take, their possible side effects, and which medicines are safe to take together.  Make it your goal to take part in all treatment decisions (shared decision-making). Ask about possible side effects of medicines that your health care provider recommends, and tell him or her how you feel about having those side effects. It is best if shared decision-making with your health care provider is part of your total treatment plan.  If your health care provider prescribes a medicine, you may not notice the full benefits of it for 4-8 weeks. Most people who are treated for PTSD need to take medicine for at least 6-12 months after they feel better. If you are taking medicines as part of your treatment, do not stop taking medicines before you ask your health care provider if it is safe to stop. You may need to have the medicine slowly decreased (tapered) over time to lower the risk of harmful side effects. Relationships Many people who have PTSD have difficulty trusting others. Make an effort to:  Take risks and develop trust with close friends and family members. Developing trust in others can help you feel safe and connect you with emotional support.  Be open and honest about your feelings.  Try to have fun and relax in safe spaces, such as with friends and family.  Think about going to couples counseling, family education classes, or family therapy. Your loved ones may not always know how to be supportive. Therapy can be helpful for everyone.  How to recognize changes in your condition Be aware of your  symptoms and how often you have them. The following symptoms mean that you need to seek help for your PTSD:  You feel suspicious and angry.  You have repeated flashbacks.  You avoid going out or being with others.  You have an increasing number of fights with close friends or family members, such as your spouse.  You have thoughts about hurting yourself or  others.  You cannot get relief from feelings of depression or anxiety.  Where to find support Talking to others  Explain that PTSD is a mental health problem. It is something that a person can develop after experiencing or seeing a life-threatening event. Tell them that PTSD makes you feel stress like you did during the event.  Talk to your loved ones about the symptoms you have. Also tell them what things or situations can cause symptoms to start (are triggers for you).  Assure your loved ones that there are treatments to help PTSD. Discuss possibly seeking family therapy or couples therapy.  If you are worried or fearful about seeking treatment, ask for support. Finances Not all insurance plans cover mental health care, so it is important to check with your insurance carrier. If paying for co-pays or counseling services is a problem, search for a local or county mental health care center. Public mental health care services may be offered there at a low cost or no cost when you are not able to see a private health care provider. If you are a veteran, contact a local veterans organization or veterans hospital for more information. If you are taking medicine for PTSD, you may be able to get the genericform, which may be less expensive than brand-name medicine. Some makers of prescription medicines also offer help to patients who cannot afford the medicines that they need. Community resources  Find a support group in your community. Often, groups are available for [employer] veterans, trauma victims, and family members or caregivers.  Look into volunteer opportunities. Taking part in these can help you feel more connected to your community.  Contact a local organization to find out if you are eligible for a service dog.  Keep daily contact with at least one trusted friend or family member. Follow these instructions at home: Lifestyle  Exercise regularly. Try to do 30 or more minutes of  physical activity on most days of the week.  Try to get 7-9 hours of sleep each night. To help with sleep: ? Keep your bedroom cool and dark. ? Do not eat a heavy meal during the hour before you go to bed. ? Do not drink alcohol or caffeinated drinks before bed. ? Avoid screen time before bedtime. This means avoiding use of your TV, computer, tablet, and cell phone.  Avoid using alcohol or drugs.  Practice self-soothing skills and use them daily.  Try to have fun and seek humor in your life. General instructions  If your PTSD is affecting your marriage or family, seek help from a family therapist.  Take over-the-counter and prescription medicines only as told by your health care provider.  Make sure to let all of your health care providers know that you have PTSD. This is especially important if you are having surgery or need to be admitted to the hospital.  Keep all follow-up visits as told by your health care providers. This is important. Where to find more information: Go to this website to find more information about PTSD, treatment for PTSD, and how to get support:  [employer]  [facility]: www.ptsd.[employer].cz  Contact a health care provider if:  Your symptoms get worse or they do not get better. Get help right away if:  You have thoughts about hurting yourself or others. If you ever feel like you may hurt yourself or others, or have thoughts about taking your own life, get help right away. You can go to your nearest emergency department or call:  Your local emergency services (911 in the [place]  A suicide crisis helpline, such as the [facility] at (470)378-1570. This is open 24-hours a day.  Summary  If you are living with PTSD, there are ways to help you recover from it and manage your symptoms.  Find supportive environments and people who understand PTSD. Spend time in those places, and maintain contact with those people.  Work with your  health care team to create a management plan that includes counseling, stress management techniques, and healthy lifestyle habits. This information is not intended to replace advice given to you by your health care provider. Make sure you discuss any questions you have with your health care provider. Document Released: 04/06/2017 Document Revised: 04/06/2017 Document Reviewed: 04/06/2017 Elsevier Interactive Patient Education  [employer].

## 2018-10-27 LAB — CMP14+EGFR
ALBUMIN: 4.6 g/dL (ref 3.5–5.5)
ALT: 24 IU/L (ref 0–32)
AST: 25 IU/L (ref 0–40)
Albumin/Globulin Ratio: 1.8 (ref 1.2–2.2)
Alkaline Phosphatase: 114 IU/L (ref 39–117)
BUN / CREAT RATIO: 21 (ref 9–23)
BUN: 15 mg/dL (ref 6–24)
Bilirubin Total: <0.2 mg/dL (ref 0.0–1.2)
CHLORIDE: 104 mmol/L (ref 96–106)
CO2: 18 mmol/L — AB (ref 20–29)
Calcium: 9.5 mg/dL (ref 8.7–10.2)
Creatinine, Ser: 0.72 mg/dL (ref 0.57–1.00)
GFR, EST AFRICAN AMERICAN: 111 mL/min/{1.73_m2} (ref >59)
GFR, EST NON AFRICAN AMERICAN: 96 mL/min/{1.73_m2} (ref >59)
GLUCOSE: 107 mg/dL — AB (ref 65–99)
Globulin, Total: 2.5 g/dL (ref 1.5–4.5)
Potassium: 5.3 mmol/L — ABNORMAL HIGH (ref 3.5–5.2)
Sodium: 149 mmol/L — ABNORMAL HIGH (ref 134–144)
TOTAL PROTEIN: 7.1 g/dL (ref 6.0–8.5)

## 2018-10-27 LAB — LIPID PANEL
CHOL/HDL RATIO: 2.9 ratio (ref 0.0–4.4)
Cholesterol, Total: 142 mg/dL (ref 100–199)
HDL: 49 mg/dL (ref >39)
LDL Calculated: 77 mg/dL (ref 0–99)
Triglycerides: 78 mg/dL (ref 0–149)
VLDL Cholesterol Cal: 16 mg/dL (ref 5–40)

## 2019-02-25 ENCOUNTER — Other Ambulatory Visit: Payer: Self-pay | Admitting: Nurse Practitioner

## 2019-02-25 DIAGNOSIS — F411 Generalized anxiety disorder: Secondary | ICD-10-CM

## 2019-03-19 ENCOUNTER — Other Ambulatory Visit: Payer: Self-pay | Admitting: *Deleted

## 2019-03-19 DIAGNOSIS — E782 Mixed hyperlipidemia: Secondary | ICD-10-CM

## 2019-03-19 MED ORDER — ATORVASTATIN CALCIUM 40 MG PO TABS: 40.0000 mg | ORAL_TABLET | Freq: Every day | ORAL | 5 refills | Status: DC

## 2019-03-29 ENCOUNTER — Other Ambulatory Visit: Payer: Self-pay | Admitting: Nurse Practitioner

## 2019-03-29 DIAGNOSIS — F3341 Major depressive disorder, recurrent, in partial remission: Secondary | ICD-10-CM

## 2019-03-29 DIAGNOSIS — F411 Generalized anxiety disorder: Secondary | ICD-10-CM

## 2019-04-01 NOTE — Telephone Encounter (Signed)
Last seen 10/26/18 

## 2019-04-16 ENCOUNTER — Other Ambulatory Visit: Payer: Self-pay | Admitting: *Deleted

## 2019-04-16 DIAGNOSIS — E782 Mixed hyperlipidemia: Secondary | ICD-10-CM

## 2019-04-16 MED ORDER — ATORVASTATIN CALCIUM 40 MG PO TABS: 40.0000 mg | ORAL_TABLET | Freq: Every day | ORAL | 1 refills | Status: DC

## 2019-04-26 ENCOUNTER — Encounter: Payer: Self-pay | Admitting: Nurse Practitioner

## 2019-04-26 ENCOUNTER — Other Ambulatory Visit: Payer: Self-pay

## 2019-04-26 ENCOUNTER — Ambulatory Visit (INDEPENDENT_AMBULATORY_CARE_PROVIDER_SITE_OTHER): Payer: Medicare Other | Admitting: Nurse Practitioner

## 2019-04-26 DIAGNOSIS — E782 Mixed hyperlipidemia: Secondary | ICD-10-CM

## 2019-04-26 DIAGNOSIS — F3341 Major depressive disorder, recurrent, in partial remission: Secondary | ICD-10-CM | POA: Diagnosis not present

## 2019-04-26 DIAGNOSIS — F411 Generalized anxiety disorder: Secondary | ICD-10-CM | POA: Diagnosis not present

## 2019-04-26 DIAGNOSIS — Z6832 Body mass index (BMI) 32.0-32.9, adult: Secondary | ICD-10-CM

## 2019-04-26 MED ORDER — VENLAFAXINE HCL ER 150 MG PO CP24: 300.0000 mg | ORAL_CAPSULE | Freq: Every day | ORAL | 1 refills | Status: DC

## 2019-04-26 MED ORDER — ALPRAZOLAM 1 MG PO TABS: 1.0000 mg | ORAL_TABLET | Freq: Two times a day (BID) | ORAL | 2 refills | Status: DC

## 2019-04-26 MED ORDER — ATORVASTATIN CALCIUM 40 MG PO TABS: 40.0000 mg | ORAL_TABLET | Freq: Every day | ORAL | 1 refills | Status: DC

## 2019-04-26 NOTE — Progress Notes (Signed)
Virtual Visit via telephone Note  I connected with Brianna Sanchez on 04/26/19 at 9:00 AM by telephone and verified that I am speaking with the correct person using two identifiers. Brianna Sanchez is currently located at home and her husband is currently with her during visit. The provider, [name], FNP is located in their office at time of visit.  I discussed the limitations, risks, security and privacy concerns of performing an evaluation and management service by telephone and the availability of in person appointments. I also discussed with the patient that there may be a patient responsible charge related to this service. The patient expressed understanding and agreed to proceed.   History and Present Illness:   Chief Complaint: Medical Management of Chronic Issues    HPI:  1. Mixed hyperlipidemia She tries to watch diet. Does some exercise , not much  2. GAD (generalized anxiety disorder) Stays very stressed. 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  3. Recurrent major depressive disorder, in partial remission (Brianna Sanchez) Has been on effexor 150mg  daily. Says she is doing okay. Depression screen [facility] 2/9 04/26/2019 10/26/2018 06/25/2018  Decreased Interest 1 1 3   Down, Depressed, Hopeless 1 2 2   PHQ - 2 Score 2 3 5   Altered sleeping 1 3 3   Tired, decreased energy 2 1 1   Change in appetite 0 3 3  Feeling bad or failure about yourself  0 3 2  Trouble concentrating 0 1 1  Moving slowly or fidgety/restless 0 1 1  Suicidal thoughts 0 1 1  PHQ-9 Score 5 16 17   Difficult doing work/chores Somewhat difficult - -  Some recent data might be hidden     4. BMI 32.0-32.9,adult No recent weight changes    Outpatient Encounter Medications as of 04/26/2019  Medication Sig  . ALPRAZolam (XANAX) 1 MG tablet TAKE ONE TABLET TWICE A DAY.  Marland Kitchen atorvastatin (LIPITOR) 40 MG tablet Take 1 tablet (40 mg total) by mouth daily.  . Triamcinolone Acetonide (TRIAMCINOLONE 0.1 %  CREAM : EUCERIN) CREA Apply 1 application topically 2 (two) times daily.  Marland Kitchen venlafaxine XR (EFFEXOR-XR) 150 MG 24 hr capsule TAKE (2) CAPSULES ONCE DAILY.     New complaints: None today  Social history: Lives with her husband. She says that her dad was recently diagnosed with cancer and is currently undergoing chemotherapy. Her daughter is still in jail for attempted murder of her dad.    Review of Systems  Constitutional: Negative for diaphoresis and weight loss.  Eyes: Negative for blurred vision, double vision and pain.  Respiratory: Negative for shortness of breath.   Cardiovascular: Negative for chest pain, palpitations, orthopnea and leg swelling.  Gastrointestinal: Negative for abdominal pain.  Skin: Negative for rash.  Neurological: Negative for dizziness, sensory change, loss of consciousness, weakness and headaches.  Endo/Heme/Allergies: Negative for polydipsia. Does not bruise/bleed easily.  Psychiatric/Behavioral: Positive for depression. Negative for memory loss. The patient is nervous/anxious. The patient does not have insomnia.   All other systems reviewed and are negative.    Observations/Objective: Alert and oriented- answers all questions appropriately No distress  Assessment and Plan: Brianna Sanchez comes in today with chief complaint of Medical Management of Chronic Issues   Diagnosis and orders addressed:  1. Mixed hyperlipidemia Continue to watch diet - atorvastatin (LIPITOR) 40 MG tablet; Take 1 tablet (40 mg total) by mouth daily.  Dispense: 90 tablet; Refill: 1  2. GAD (generalized anxiety disorder) Stress management - ALPRAZolam (  XANAX) 1 MG tablet; Take 1 tablet (1 mg total) by mouth 2 (two) times daily.  Dispense: 60 tablet; Refill: 2  3. Recurrent major depressive disorder, in partial remission ([facility]) - venlafaxine XR (EFFEXOR-XR) 150 MG 24 hr capsule; Take 2 capsules (300 mg total) by mouth daily with breakfast.  Dispense: 180 capsule;  Refill: 1  4. BMI 32.0-32.9,adult Discussed diet and exercise for person with BMI >25 Will recheck weight in 3-6 months   Needs labs at next visit Health Maintenance reviewed Diet and exercise encouraged  Follow up plan: 3 months     I discussed the assessment and treatment plan with the patient. The patient was provided an opportunity to ask questions and all were answered. The patient agreed with the plan and demonstrated an understanding of the instructions.   The patient was advised to call back or seek an in-person evaluation if the symptoms worsen or if the condition fails to improve as anticipated.  The above assessment and management plan was discussed with the patient. The patient verbalized understanding of and has agreed to the management plan. Patient is aware to call the clinic if symptoms persist or worsen. Patient is aware when to return to the clinic for a follow-up visit. Patient educated on when it is appropriate to go to the emergency department.   Time call ended:  9:15  I provided 20 minutes of non-face-to-face time during this encounter.    [name], FNP

## 2019-06-04 DIAGNOSIS — Z1231 Encounter for screening mammogram for malignant neoplasm of breast: Secondary | ICD-10-CM | POA: Diagnosis not present

## 2019-06-04 LAB — HM MAMMOGRAPHY

## 2019-07-22 ENCOUNTER — Telehealth: Payer: Self-pay | Admitting: Nurse Practitioner

## 2019-07-22 NOTE — Telephone Encounter (Signed)
Patient complains of blood in urine and vaginal odor for about 1 week.  Patient has had some relief with cranberry juice.  Can patient leave urine sample?

## 2019-07-23 ENCOUNTER — Ambulatory Visit (INDEPENDENT_AMBULATORY_CARE_PROVIDER_SITE_OTHER): Payer: Medicare Other | Admitting: Family

## 2019-07-23 ENCOUNTER — Encounter: Payer: Self-pay | Admitting: Family

## 2019-07-23 ENCOUNTER — Other Ambulatory Visit: Payer: Self-pay

## 2019-07-23 VITALS — BP 121/86 | HR 85 | Temp 97.6°F | Ht 66.0 in | Wt 185.6 lb

## 2019-07-23 DIAGNOSIS — R300 Dysuria: Secondary | ICD-10-CM | POA: Diagnosis not present

## 2019-07-23 DIAGNOSIS — N898 Other specified noninflammatory disorders of vagina: Secondary | ICD-10-CM

## 2019-07-23 DIAGNOSIS — R399 Unspecified symptoms and signs involving the genitourinary system: Secondary | ICD-10-CM

## 2019-07-23 LAB — WET PREP FOR TRICH, YEAST, CLUE
Clue Cell Exam: NEGATIVE
Trichomonas Exam: NEGATIVE
Yeast Exam: NEGATIVE

## 2019-07-23 LAB — MICROSCOPIC EXAMINATION
RBC, Urine: NONE SEEN /hpf (ref 0–2)
Renal Epithel, UA: NONE SEEN /hpf

## 2019-07-23 LAB — URINALYSIS, COMPLETE
Bilirubin, UA: NEGATIVE
Glucose, UA: NEGATIVE
Ketones, UA: NEGATIVE
Leukocytes,UA: NEGATIVE
Nitrite, UA: NEGATIVE
Protein,UA: NEGATIVE
RBC, UA: NEGATIVE
Specific Gravity, UA: 1.02 (ref 1.005–1.030)
Urobilinogen, Ur: 0.2 mg/dL (ref 0.2–1.0)
pH, UA: 6.5 (ref 5.0–7.5)

## 2019-07-23 MED ORDER — CEPHALEXIN 500 MG PO CAPS: 500.0000 mg | ORAL_CAPSULE | Freq: Two times a day (BID) | ORAL | 0 refills | Status: DC

## 2019-07-23 NOTE — Patient Instructions (Signed)

## 2019-07-23 NOTE — Progress Notes (Signed)
   Subjective:    Patient ID: Brianna Sanchez, female    DOB: 1966-10-11, 53 y.o.   MRN: [id number]  Chief Complaint  Patient presents with  . Hematuria    Hematuria This is a new problem. The current episode started in the past 7 days. The problem has been gradually worsening since onset. The hematuria occurs during the initial portion of her urinary stream. Her pain is at a severity of 5/10 (pressure). The pain is mild. Irritative symptoms include frequency and urgency. Obstructive symptoms do not include incomplete emptying. Associated symptoms include flank pain (mild) and nausea. Pertinent negatives include no dysuria.      Review of Systems  Gastrointestinal: Positive for nausea.  Genitourinary: Positive for flank pain (mild), frequency, hematuria and urgency. Negative for dysuria and incomplete emptying.  All other systems reviewed and are negative.      Objective:   Physical Exam Vitals signs reviewed.  Constitutional:      General: She is not in acute distress.    Appearance: She is well-developed.  HENT:     Head: Normocephalic and atraumatic.     Right Ear: External ear normal.  Eyes:     Pupils: Pupils are equal, round, and reactive to light.  Neck:     Musculoskeletal: Normal range of motion and neck supple.     Thyroid: No thyromegaly.  Cardiovascular:     Rate and Rhythm: Normal rate and regular rhythm.     Heart sounds: Normal heart sounds. No murmur.  Pulmonary:     Effort: Pulmonary effort is normal. No respiratory distress.     Breath sounds: Normal breath sounds. No wheezing.  Abdominal:     General: Bowel sounds are normal. There is no distension.     Palpations: Abdomen is soft.     Tenderness: There is abdominal tenderness (mild lower).  Musculoskeletal: Normal range of motion.        General: No tenderness.  Skin:    General: Skin is warm and dry.  Neurological:     Mental Status: She is alert and oriented to person, place, and time.   Cranial Nerves: No cranial nerve deficit.     Deep Tendon Reflexes: Reflexes are normal and symmetric.  Psychiatric:        Behavior: Behavior normal.        Thought Content: Thought content normal.        Judgment: Judgment normal.       BP 121/86   Pulse 85   Temp 97.6 F (36.4 C) (Oral)   Ht 5\' 6"  (1.676 m)   Wt 185 lb 9.6 oz (84.2 kg)   BMI 29.96 kg/m      Assessment & Plan:  Brianna Sanchez comes in today with chief complaint of Hematuria   Diagnosis and orders addressed:  1. Dysuria - Urinalysis, Complete  2. UTI symptoms Force fluids AZO over the counter X2 days RTO prn Culture pending - Urine Culture - cephALEXin (KEFLEX) 500 MG capsule; Take 1 capsule (500 mg total) by mouth 2 (two) times daily.  Dispense: 14 capsule; Refill: 0  3. Vaginal odor - WET PREP FOR TRICH, YEAST, CLUE   Brianna Dun, FNP

## 2019-07-24 ENCOUNTER — Other Ambulatory Visit: Payer: Self-pay

## 2019-07-25 ENCOUNTER — Encounter: Payer: Self-pay | Admitting: Nurse Practitioner

## 2019-07-25 ENCOUNTER — Ambulatory Visit (INDEPENDENT_AMBULATORY_CARE_PROVIDER_SITE_OTHER): Payer: Medicare Other | Admitting: Nurse Practitioner

## 2019-07-25 VITALS — BP 120/80 | HR 67 | Temp 97.2°F | Ht 66.0 in | Wt 187.0 lb

## 2019-07-25 DIAGNOSIS — F411 Generalized anxiety disorder: Secondary | ICD-10-CM

## 2019-07-25 DIAGNOSIS — Z6832 Body mass index (BMI) 32.0-32.9, adult: Secondary | ICD-10-CM

## 2019-07-25 DIAGNOSIS — F3341 Major depressive disorder, recurrent, in partial remission: Secondary | ICD-10-CM | POA: Diagnosis not present

## 2019-07-25 DIAGNOSIS — E782 Mixed hyperlipidemia: Secondary | ICD-10-CM

## 2019-07-25 LAB — URINE CULTURE

## 2019-07-25 MED ORDER — ATORVASTATIN CALCIUM 40 MG PO TABS: 40.0000 mg | ORAL_TABLET | Freq: Every day | ORAL | 1 refills | Status: DC

## 2019-07-25 MED ORDER — ALPRAZOLAM 1 MG PO TABS: 1.0000 mg | ORAL_TABLET | Freq: Two times a day (BID) | ORAL | 2 refills | Status: DC

## 2019-07-25 MED ORDER — VENLAFAXINE HCL ER 150 MG PO CP24: 300.0000 mg | ORAL_CAPSULE | Freq: Every day | ORAL | 1 refills | Status: DC

## 2019-07-25 NOTE — Progress Notes (Signed)
Subjective:    Patient ID: Brianna Sanchez, female    DOB: 1966/11/21, 53 y.o.   MRN: [id number]   Chief Complaint: medical management of chronic issues   HPI:  1. Mixed hyperlipidemia Does not watch diet and does very little exercise  2. Recurrent major depressive disorder, in partial remission (HCC)pp Patient is currently on effexor and is doing as well as could be expected. She is still deal with family issues and struggles at times. Depression screen [facility] 2/9 07/25/2019 07/23/2019 04/26/2019  Decreased Interest 0 0 1  Down, Depressed, Hopeless 0 0 1  PHQ - 2 Score 0 0 2  Altered sleeping - - 1  Tired, decreased energy - - 2  Change in appetite - - 0  Feeling bad or failure about yourself  - - 0  Trouble concentrating - - 0  Moving slowly or fidgety/restless - - 0  Suicidal thoughts - - 0  PHQ-9 Score - - 5  Difficult doing work/chores - - Somewhat difficult  Some recent data might be hidden    3. GAD (generalized anxiety disorder) Patient is on xanax BID and says she is okay. She has always been a very nervous person and the xanax helps to keep her calm.  4. BMI 32.0-32.9,adult No recnent weight changes    Outpatient Encounter Medications as of 07/25/2019  Medication Sig  . ALPRAZolam (XANAX) 1 MG tablet Take 1 tablet (1 mg total) by mouth 2 (two) times daily.  Marland Kitchen atorvastatin (LIPITOR) 40 MG tablet Take 1 tablet (40 mg total) by mouth daily.  . cephALEXin (KEFLEX) 500 MG capsule Take 1 capsule (500 mg total) by mouth 2 (two) times daily.  . Triamcinolone Acetonide (TRIAMCINOLONE 0.1 % CREAM : EUCERIN) CREA Apply 1 application topically 2 (two) times daily.  Marland Kitchen venlafaxine XR (EFFEXOR-XR) 150 MG 24 hr capsule Take 2 capsules (300 mg total) by mouth daily with breakfast.    Past Surgical History:  Procedure Laterality Date  . hemrrhoid      Family History  Problem Relation Age of Onset  . Cancer Father        stomach  . Diabetes Father   . Heart attack Father   .  Heart disease Father     New complaints: Was seen for UTI on Monday of this week and was given rx keflex. Is much better  Social history: Is on disability for her anxiety- her dad passed away in 06-18-2023  Controlled substance contract: 11/01/18    Review of Systems  Constitutional: Negative for activity change and appetite change.  HENT: Negative.   Eyes: Negative for pain.  Respiratory: Negative for shortness of breath.   Cardiovascular: Negative for chest pain, palpitations and leg swelling.  Gastrointestinal: Negative for abdominal pain.  Endocrine: Negative for polydipsia.  Genitourinary: Negative.   Skin: Negative for rash.  Neurological: Negative for dizziness, weakness and headaches.  Hematological: Does not bruise/bleed easily.  Psychiatric/Behavioral: Negative.   All other systems reviewed and are negative.      Objective:   Physical Exam Vitals signs and nursing note reviewed.  Constitutional:      General: She is not in acute distress.    Appearance: Normal appearance. She is well-developed.  HENT:     Head: Normocephalic.     Nose: Nose normal.  Eyes:     Pupils: Pupils are equal, round, and reactive to light.  Neck:     Musculoskeletal: Normal range of motion and neck supple.  Vascular: No carotid bruit or JVD.  Cardiovascular:     Rate and Rhythm: Normal rate and regular rhythm.     Heart sounds: Normal heart sounds.  Pulmonary:     Effort: Pulmonary effort is normal. No respiratory distress.     Breath sounds: Normal breath sounds. No wheezing or rales.  Chest:     Chest wall: No tenderness.  Abdominal:     General: Bowel sounds are normal. There is no distension or abdominal bruit.     Palpations: Abdomen is soft. There is no hepatomegaly, splenomegaly, mass or pulsatile mass.     Tenderness: There is no abdominal tenderness.  Musculoskeletal: Normal range of motion.  Lymphadenopathy:     Cervical: No cervical adenopathy.  Skin:    General:  Skin is warm and dry.  Neurological:     Mental Status: She is alert and oriented to person, place, and time.     Deep Tendon Reflexes: Reflexes are normal and symmetric.  Psychiatric:        Behavior: Behavior normal.        Thought Content: Thought content normal.        Judgment: Judgment normal.    BP 120/80   Pulse 67   Temp (!) 97.2 F (36.2 C) (Oral)   Ht '5\' 6"'  (1.676 m)   Wt 187 lb (84.8 kg)   BMI 30.18 kg/m         Assessment & Plan:  KATRESE SHELL comes in today with chief complaint of Medical Management of Chronic Issues   Diagnosis and orders addressed:  1. Mixed hyperlipidemia Low fat diet - atorvastatin (LIPITOR) 40 MG tablet; Take 1 tablet (40 mg total) by mouth daily.  Dispense: 90 tablet; Refill: 1 - CMP14+EGFR - Lipid panel  2. Recurrent major depressive disorder, in partial remission (HCC) Stress management - venlafaxine XR (EFFEXOR-XR) 150 MG 24 hr capsule; Take 2 capsules (300 mg total) by mouth daily with breakfast.  Dispense: 180 capsule; Refill: 1  3. GAD (generalized anxiety disorder) Stress management - ALPRAZolam (XANAX) 1 MG tablet; Take 1 tablet (1 mg total) by mouth 2 (two) times daily.  Dispense: 60 tablet; Refill: 2  4. BMI 32.0-32.9,adult Discussed diet and exercise for person with BMI >25 Will recheck weight in 3-6 months   Labs pending Health Maintenance reviewed Diet and exercise encouraged  Follow up plan: 6 months   [name], FNP

## 2019-07-25 NOTE — Patient Instructions (Signed)

## 2019-07-26 LAB — LIPID PANEL
Chol/HDL Ratio: 3.2 ratio (ref 0.0–4.4)
Cholesterol, Total: 151 mg/dL (ref 100–199)
HDL: 47 mg/dL (ref >39)
LDL Calculated: 80 mg/dL (ref 0–99)
Triglycerides: 121 mg/dL (ref 0–149)
VLDL Cholesterol Cal: 24 mg/dL (ref 5–40)

## 2019-07-26 LAB — CMP14+EGFR
ALT: 27 IU/L (ref 0–32)
AST: 22 IU/L (ref 0–40)
Albumin/Globulin Ratio: 1.9 (ref 1.2–2.2)
Albumin: 4.8 g/dL (ref 3.8–4.9)
Alkaline Phosphatase: 106 IU/L (ref 39–117)
BUN/Creatinine Ratio: 15 (ref 9–23)
BUN: 10 mg/dL (ref 6–24)
Bilirubin Total: 0.2 mg/dL (ref 0.0–1.2)
CO2: 24 mmol/L (ref 20–29)
Calcium: 9.9 mg/dL (ref 8.7–10.2)
Chloride: 102 mmol/L (ref 96–106)
Creatinine, Ser: 0.66 mg/dL (ref 0.57–1.00)
GFR calc Af Amer: 116 mL/min/{1.73_m2} (ref >59)
GFR calc non Af Amer: 100 mL/min/{1.73_m2} (ref >59)
Globulin, Total: 2.5 g/dL (ref 1.5–4.5)
Glucose: 83 mg/dL (ref 65–99)
Potassium: 4.8 mmol/L (ref 3.5–5.2)
Sodium: 143 mmol/L (ref 134–144)
Total Protein: 7.3 g/dL (ref 6.0–8.5)

## 2019-08-12 ENCOUNTER — Ambulatory Visit (INDEPENDENT_AMBULATORY_CARE_PROVIDER_SITE_OTHER): Payer: Medicare Other | Admitting: Nurse Practitioner

## 2019-08-12 ENCOUNTER — Encounter: Payer: Self-pay | Admitting: Nurse Practitioner

## 2019-08-12 ENCOUNTER — Other Ambulatory Visit: Payer: Self-pay

## 2019-08-12 DIAGNOSIS — J0100 Acute maxillary sinusitis, unspecified: Secondary | ICD-10-CM | POA: Diagnosis not present

## 2019-08-12 MED ORDER — BENZONATATE 100 MG PO CAPS: 100.0000 mg | ORAL_CAPSULE | Freq: Three times a day (TID) | ORAL | 0 refills | Status: DC | PRN

## 2019-08-12 MED ORDER — AMOXICILLIN-POT CLAVULANATE 875-125 MG PO TABS: 1.0000 | ORAL_TABLET | Freq: Two times a day (BID) | ORAL | 0 refills | Status: DC

## 2019-08-12 NOTE — Progress Notes (Signed)
Virtual Visit via telephone Note Due to COVID-19 pandemic this visit was conducted virtually. This visit type was conducted due to national recommendations for restrictions regarding the COVID-19 Pandemic (e.g. social distancing, sheltering in place) in an effort to limit this patient's exposure and mitigate transmission in our community. All issues noted in this document were discussed and addressed.  A physical exam was not performed with this format.  I connected with Brianna Sanchez on 08/12/19 at 8:45 by telephone and verified that I am speaking with the correct person using two identifiers. Brianna Sanchez is currently located at home and her husband is currently with her during visit. The provider, [name], FNP is located in their office at time of visit.  I discussed the limitations, risks, security and privacy concerns of performing an evaluation and management service by telephone and the availability of in person appointments. I also discussed with the patient that there may be a patient responsible charge related to this service. The patient expressed understanding and agreed to proceed.   History and Present Illness:   Chief Complaint: URI   HPI Patient calls in c/o of sore throat that started over a week ago. She is coughing up thick mucus with facial sinus pressure. Has slight headache.   Review of Systems  Constitutional: Negative for chills and fever.  HENT: Positive for congestion, ear pain, sinus pain and sore throat. Negative for ear discharge.   Eyes: Negative.   Respiratory: Positive for cough.   Cardiovascular: Negative.   Gastrointestinal: Negative.   Genitourinary: Negative.   Musculoskeletal: Negative.   Neurological: Positive for dizziness and headaches.  Endo/Heme/Allergies: Negative.   Psychiatric/Behavioral: Negative.   All other systems reviewed and are negative.    Observations/Objective: Alert and oriented- answers all questions  appropriately No distress Voice froggy  Assessment and Plan: [name] in today with chief complaint of URI   1. Acute maxillary sinusitis, recurrence not specified 1. Take meds as prescribed 2. Use a cool mist humidifier especially during the winter months and when heat has been humid. 3. Use saline nose sprays frequently 4. Saline irrigations of the nose can be very helpful if done frequently.  * 4X daily for 1 week*  * Use of a nettie pot can be helpful with this. Follow directions with this* 5. Drink plenty of fluids 6. Keep thermostat turn down low 7.For any cough or congestion  Use plain Mucinex- regular strength or max strength is fine   * Children- consult with Pharmacist for dosing 8. For fever or aces or pains- take tylenol or ibuprofen appropriate for age and weight.  * for fevers greater than 101 orally you may alternate ibuprofen and tylenol every  3 hours.   Meds ordered this encounter  Medications  . amoxicillin-clavulanate (AUGMENTIN) 875-125 MG tablet    Sig: Take 1 tablet by mouth 2 (two) times daily.    Dispense:  20 tablet    Refill:  0    Order Specific Question:   Supervising Provider    Answer:   Caryl Pina A [id number]  . benzonatate (TESSALON PERLES) 100 MG capsule    Sig: Take 1 capsule (100 mg total) by mouth 3 (three) times daily as needed for cough.    Dispense:  20 capsule    Refill:  0    Order Specific Question:   Supervising Provider    Answer:   Caryl Pina A [id number]     Follow Up Instructions: prn  I discussed the assessment and treatment plan with the patient. The patient was provided an opportunity to ask questions and all were answered. The patient agreed with the plan and demonstrated an understanding of the instructions.   The patient was advised to call back or seek an in-person evaluation if the symptoms worsen or if the condition fails to improve as anticipated.  The above assessment and management plan  was discussed with the patient. The patient verbalized understanding of and has agreed to the management plan. Patient is aware to call the clinic if symptoms persist or worsen. Patient is aware when to return to the clinic for a follow-up visit. Patient educated on when it is appropriate to go to the emergency department.   Time call ended:  8:50  I provided 10 minutes of non-face-to-face time during this encounter.    [name], FNP

## 2019-08-21 ENCOUNTER — Ambulatory Visit (INDEPENDENT_AMBULATORY_CARE_PROVIDER_SITE_OTHER): Payer: Medicare Other | Admitting: *Deleted

## 2019-08-21 DIAGNOSIS — Z0000 Encounter for general adult medical examination without abnormal findings: Secondary | ICD-10-CM | POA: Diagnosis not present

## 2019-08-21 NOTE — Progress Notes (Signed)
MEDICARE ANNUAL WELLNESS VISIT  08/21/2019  Telephone Visit Disclaimer This Medicare AWV was conducted by telephone due to national recommendations for restrictions regarding the COVID-19 Pandemic (e.g. social distancing).  I verified, using two identifiers, that I am speaking with Brianna Sanchez or their authorized healthcare agent. I discussed the limitations, risks, security, and privacy concerns of performing an evaluation and management service by telephone and the potential availability of an in-person appointment in the future. The patient expressed understanding and agreed to proceed.   Subjective:  Brianna Sanchez is a 53 y.o. female patient of Chevis Pretty, [place] who had a Medicare Annual Wellness Visit today via telephone. Brianna Sanchez is Disabled and lives with her spouse and her mother. She has 2 children. She reports that she is not socially active and does  interact with friends/family regularly. She is not physically active and enjoys spending time with grandson.  Patient Care Team: Chevis Pretty, FNP as PCP - General (Nurse Practitioner)  Advanced Directives 08/21/2019 12/17/2015  Does Patient Have a Medical Advance Directive? No No  Would patient like information on creating a medical advance directive? No - Patient declined No - patient declined information    Hospital Utilization Over the Past 12 Months: # of hospitalizations or ER visits: 0 # of surgeries: 0  Review of Systems    Patient reports that her overall health is unchanged compared to last year.  History obtained from chart review and the patient  Patient Reported Readings (BP, Pulse, CBG, Weight, etc) none  Pain Assessment       Current Medications & Allergies (verified) Allergies as of 08/21/2019      Reactions   Codeine    Medroxyprogesterone       Medication List       Accurate as of August 21, 2019 11:04 AM. If you have any questions, ask your nurse or doctor.          STOP taking these medications   cephALEXin 500 MG capsule Commonly known as: KEFLEX   triamcinolone 0.1 % cream : eucerin Crea     TAKE these medications   ALPRAZolam 1 MG tablet Commonly known as: XANAX Take 1 tablet (1 mg total) by mouth 2 (two) times daily.   amoxicillin-clavulanate 875-125 MG tablet Commonly known as: AUGMENTIN Take 1 tablet by mouth 2 (two) times daily.   atorvastatin 40 MG tablet Commonly known as: LIPITOR Take 1 tablet (40 mg total) by mouth daily.   benzonatate 100 MG capsule Commonly known as: Tessalon Perles Take 1 capsule (100 mg total) by mouth 3 (three) times daily as needed for cough.   venlafaxine XR 150 MG 24 hr capsule Commonly known as: EFFEXOR-XR Take 2 capsules (300 mg total) by mouth daily with breakfast.       History (reviewed): Past Medical History:  Diagnosis Date   Depression    GAD (generalized anxiety disorder)    Hyperlipidemia    Past Surgical History:  Procedure Laterality Date   hemrrhoid     Family History  Problem Relation Age of Onset   Cancer Father        stomach   Diabetes Father    Heart attack Father    Heart disease Father    Hyperlipidemia Brother    Social History   Socioeconomic History   Marital status: Married    Spouse name: Not on file   Number of children: 2   Years of education: Not on file  Highest education level: GED or equivalent  Occupational History   Occupation: disable  Scientist, product/process development strain: Very hard   Food insecurity    Worry: Sometimes true    Inability: Sometimes true   Transportation needs    Medical: No    Non-medical: No  Tobacco Use   Smoking status: Current Every Day Smoker   Smokeless tobacco: Never Used  Substance and Sexual Activity   Alcohol use: No   Drug use: No   Sexual activity: Not on file  Lifestyle   Physical activity    Days per week: 0 days    Minutes per session: 0 min   Stress: Very much   Relationships   Social connections    Talks on phone: Once a week    Gets together: Never    Attends religious service: Never    Active member of club or organization: No    Attends meetings of clubs or organizations: Never    Relationship status: Married  Other Topics Concern   Not on file  Social History Narrative   Not on file    Activities of Daily Living In your present state of health, do you have any difficulty performing the following activities: 08/21/2019  Hearing? N  Vision? Y  Comment wears reading glasses  Difficulty concentrating or making decisions? Y  Comment at times concentrating  Walking or climbing stairs? N  Dressing or bathing? N  Doing errands, shopping? N  Preparing Food and eating ? N  Using the Toilet? N  In the past six months, have you accidently leaked urine? N  Do you have problems with loss of bowel control? N  Managing your Medications? N  Managing your Finances? N  Housekeeping or managing your Housekeeping? N  Some recent data might be hidden    Patient Education/ Literacy    Exercise Current Exercise Habits: The patient does not participate in regular exercise at present, Exercise limited by: None identified  Diet Patient reports consuming 2 meals a day and 2 snack(s) a day Patient reports that her primary diet is: Regular Patient reports that she does have regular access to food.   Depression Screen PHQ 2/9 Scores 08/21/2019 07/25/2019 07/23/2019 04/26/2019 10/26/2018 06/25/2018 12/21/2017  PHQ - 2 Score 0 0 0 2 3 5 3   PHQ- 9 Score - - - 5 16 17 15      Fall Risk Fall Risk  08/21/2019 07/25/2019 06/25/2018 12/21/2017 06/19/2017  Falls in the past year? 0 0 No No No     Objective:  Brianna Sanchez seemed alert and oriented and she participated appropriately during our telephone visit.  Blood Pressure Weight BMI  BP Readings from Last 3 Encounters:  07/25/19 120/80  07/23/19 121/86  10/26/18 114/76   Wt Readings from Last 3 Encounters:   07/25/19 187 lb (84.8 kg)  07/23/19 185 lb 9.6 oz (84.2 kg)  10/26/18 195 lb (88.5 kg)   BMI Readings from Last 1 Encounters:  07/25/19 30.18 kg/m    *Unable to obtain current vital signs, weight, and BMI due to telephone visit type  Hearing/Vision   Brianna Sanchez did not seem to have difficulty with hearing/understanding during the telephone conversation  Reports that she has not had a formal eye exam by an eye care professional within the past year  Reports that she has not had a formal hearing evaluation within the past year *Unable to fully assess hearing and vision during telephone visit type  Cognitive  Function: 6CIT Screen 08/21/2019  What Year? 0 points  What month? 0 points  What time? 0 points  Count back from 20 0 points  Months in reverse 0 points  Repeat phrase 6 points  Total Score 6   (Normal:0-7, Significant for Dysfunction: >8)  Normal Cognitive Function Screening: Yes   Immunization & Health Maintenance Record Immunization History  Administered Date(s) Administered   Influenza,inj,Quad PF,6+ Mos 12/17/2015, 12/21/2016, 11/10/2017, 10/26/2018   Influenza-Unspecified 10/24/2017, 10/24/2018   Td 04/16/1990    Health Maintenance  Topic Date Due   TETANUS/TDAP  04/16/2000   DEXA SCAN  04/22/2017   PAP SMEAR-Modifier  03/04/2019   INFLUENZA VACCINE  07/20/2019   MAMMOGRAM  06/03/2020   COLONOSCOPY  07/06/2025   HIV Screening  Completed       Assessment  This is a routine wellness examination for Brianna Sanchez.  Health Maintenance: Due or Overdue Health Maintenance Due  Topic Date Due   TETANUS/TDAP  04/16/2000   DEXA SCAN  04/22/2017   PAP SMEAR-Modifier  03/04/2019   INFLUENZA VACCINE  07/20/2019    Brianna Sanchez needs a referral for [employer] Assistance: Care Management:   no Social Work:    yes Prescription Assistance:  no Nutrition/Diabetes Education:  no  Referral was not placed at this time. Will discuss more with  patient at next visit. Patient states she does worry about having enough food. Plan:  Personalized Goals Goals Addressed            This Visit's Progress    Patient Stated       08/21/2019 AWV Goal: Exercise for General Health   Patient will verbalize understanding of the benefits of increased physical activity:  Exercising regularly is important. It will improve your overall fitness, flexibility, and endurance.  Regular exercise also will improve your overall health. It can help you control your weight, reduce stress, and improve your bone density.  Over the next year, patient will increase physical activity as tolerated with a goal of at least 150 minutes of moderate physical activity per week.   You can tell that you are exercising at a moderate intensity if your heart starts beating faster and you start breathing faster but can still hold a conversation.  Moderate-intensity exercise ideas include:  Walking 1 mile (1.6 km) in about 15 minutes  Biking  Hiking  Golfing  Dancing  Water aerobics  Patient will verbalize understanding of everyday activities that increase physical activity by providing examples like the following: ? Yard work, such as: ? Pushing a Conservation officer, nature ? Raking and bagging leaves ? Washing your car ? Pushing a stroller ? Shoveling snow ? Gardening ? Washing windows or floors  Patient will be able to explain general safety guidelines for exercising:   Before you start a new exercise program, talk with your health care provider.  Do not exercise so much that you hurt yourself, feel dizzy, or get very short of breath.  Wear comfortable clothes and wear shoes with good support.  Drink plenty of water while you exercise to prevent dehydration or heat stroke.  Work out until your breathing and your heartbeat get faster.  08/21/2019 AWV Goal: Tobacco Cessation  Smoking cessation instruction/counseling given:  counseled patient on the dangers of  tobacco use, advised patient to stop smoking, and reviewed strategies to maximize success   Patient will verbalize understanding of the health risks associated with smoking/tobacco use  Lung cancer or lung disease, such as COPD  Heart disease.  Stroke.  Heart attack  Infertility  Osteoporosis and bone fractures.  Patient will create a plan to quit smoking/using tobacco  Pick a date to quit.   Write down the reasons why you are quitting and put it where you will see it often.  Identify the people, places, things, and activities that make you want to smoke (triggers) and avoid them. Make sure to take these actions: ? Throw away all cigarettes at home, at work, and in your car. ? Throw away smoking accessories, such as Scientist, research (medical). ? Clean your car and make sure to empty the ashtray. ? Clean your home, including curtains and carpets.  Tell your family, friends, and coworkers that you are quitting. Support from your loved ones can make quitting easier.  Talk with your health care provider about your options for quitting smoking.  Find out what treatment options are covered by your health insurance.  Patient will be able to demonstrate knowledge of tobacco cessation strategies that may maximize success  Quitting cold [place] is more successful than gradually quitting.  Attending in-person counseling to help you build problem-solving skills.   Finding resources and support systems that can help you to quit smoking and remain smoke-free after you quit. These resources are most helpful when you use them often. They can include: ? Online chats with a Social worker. ? Telephone quitlines. ? Careers information officer. ? Support groups or group counseling. ? Text messaging programs. ? Mobile phone applications.  Taking medicines to help you quit smoking: ? Nicotine patches, gum, or lozenges. ? Nicotine inhalers or sprays. ? Non-nicotine medicine that is taken by  mouth.  Patient will note get discouraged if the process is difficult  Over the next year, patient will stop smoking or using other forms of tobacco  Smoking cessation instruction/counseling given:  counseled patient on the dangers of tobacco use, advised patient to stop smoking, and reviewed strategies to maximize success        Personalized Health Maintenance & Screening Recommendations  Influenza vaccine Td vaccine Screening Pap smear and pelvic exam  Bone densitometry screening  Lung Cancer Screening Recommended: no (Low Dose CT Chest recommended if Age 91-80 years, 30 pack-year currently smoking OR have quit w/in past 15 years) Hepatitis C Screening recommended: no HIV Screening recommended: no  Advanced Directives: Written information was not prepared per patient's request.  Referrals & Orders No orders of the defined types were placed in this encounter.   Follow-up Plan  Follow-up with Chevis Pretty, FNP as planned  Schedule appointment for physical   Will discuss Tdap, and dexa scan at next appointment   I have personally reviewed and noted the following in the patients chart:    Medical and social history  Use of alcohol, tobacco or illicit drugs   Current medications and supplements  Functional ability and status  Nutritional status  Physical activity  Advanced directives  List of other physicians  Hospitalizations, surgeries, and ER visits in previous 12 months  Vitals  Screenings to include cognitive, depression, and falls  Referrals and appointments  In addition, I have reviewed and discussed with Brianna Sanchez certain preventive protocols, quality metrics, and best practice recommendations. A written personalized care plan for preventive services as well as general preventive health recommendations is available and can be mailed to the patient at her request.      Gareth Morgan LPN  [phone]

## 2019-09-27 ENCOUNTER — Other Ambulatory Visit: Payer: Self-pay | Admitting: *Deleted

## 2019-09-27 DIAGNOSIS — E782 Mixed hyperlipidemia: Secondary | ICD-10-CM

## 2019-09-27 MED ORDER — ATORVASTATIN CALCIUM 40 MG PO TABS: 40.0000 mg | ORAL_TABLET | Freq: Every day | ORAL | 0 refills | Status: DC

## 2019-12-05 ENCOUNTER — Other Ambulatory Visit: Payer: Self-pay | Admitting: Nurse Practitioner

## 2019-12-05 DIAGNOSIS — F411 Generalized anxiety disorder: Secondary | ICD-10-CM

## 2019-12-05 DIAGNOSIS — F3341 Major depressive disorder, recurrent, in partial remission: Secondary | ICD-10-CM

## 2019-12-05 DIAGNOSIS — E782 Mixed hyperlipidemia: Secondary | ICD-10-CM

## 2019-12-06 ENCOUNTER — Encounter: Payer: Self-pay | Admitting: Nurse Practitioner

## 2019-12-06 ENCOUNTER — Ambulatory Visit (INDEPENDENT_AMBULATORY_CARE_PROVIDER_SITE_OTHER): Payer: Medicare Other | Admitting: Nurse Practitioner

## 2019-12-06 DIAGNOSIS — Z6832 Body mass index (BMI) 32.0-32.9, adult: Secondary | ICD-10-CM | POA: Diagnosis not present

## 2019-12-06 DIAGNOSIS — F3341 Major depressive disorder, recurrent, in partial remission: Secondary | ICD-10-CM

## 2019-12-06 DIAGNOSIS — E782 Mixed hyperlipidemia: Secondary | ICD-10-CM

## 2019-12-06 DIAGNOSIS — F411 Generalized anxiety disorder: Secondary | ICD-10-CM | POA: Diagnosis not present

## 2019-12-06 MED ORDER — ATORVASTATIN CALCIUM 40 MG PO TABS: 40.0000 mg | ORAL_TABLET | Freq: Every day | ORAL | 1 refills | Status: DC

## 2019-12-06 MED ORDER — VENLAFAXINE HCL ER 150 MG PO CP24: 300.0000 mg | ORAL_CAPSULE | Freq: Every day | ORAL | 1 refills | Status: DC

## 2019-12-06 MED ORDER — ALPRAZOLAM 1 MG PO TABS: 1.0000 mg | ORAL_TABLET | Freq: Two times a day (BID) | ORAL | 2 refills | Status: DC

## 2019-12-06 NOTE — Progress Notes (Signed)
Virtual Visit via telephone Note Due to COVID-19 pandemic this visit was conducted virtually. This visit type was conducted due to national recommendations for restrictions regarding the COVID-19 Pandemic (e.g. social distancing, sheltering in place) in an effort to limit this patient's exposure and mitigate transmission in our community. All issues noted in this document were discussed and addressed.  A physical exam was not performed with this format.  I connected with Brianna Sanchez on 12/06/19 at 2:10 by telephone and verified that I am speaking with the correct person using two identifiers. Brianna Sanchez is currently located at home and no ne is currently with  her during visit. The provider, [name], FNP is located in their office at time of visit.  I discussed the limitations, risks, security and privacy concerns of performing an evaluation and management service by telephone and the availability of in person appointments. I also discussed with the patient that there may be a patient responsible charge related to this service. The patient expressed understanding and agreed to proceed.   History and Present Illness:   Chief Complaint: Medical Management of Chronic Issues    HPI:  1. Mixed hyperlipidemia Tries to watch diet and does little to no exercise. Lab Results  Component Value Date   CHOL 151 07/25/2019   HDL 47 07/25/2019   LDLCALC 80 07/25/2019   TRIG 121 07/25/2019   CHOLHDL 3.2 07/25/2019     2. Recurrent major depressive disorder, in partial remission ([place]) Has been on effexor for some time. She says it is working well for her. Depression screen [facility] 2/9 12/06/2019 08/21/2019 07/25/2019  Decreased Interest 1 0 0  Down, Depressed, Hopeless 2 0 0  PHQ - 2 Score 3 0 0  Altered sleeping 1 - -  Tired, decreased energy 1 - -  Change in appetite 0 - -  Feeling bad or failure about yourself  0 - -  Trouble concentrating 0 - -  Moving slowly or  fidgety/restless 0 - -  Suicidal thoughts 0 - -  PHQ-9 Score 5 - -  Difficult doing work/chores Somewhat difficult - -  Some recent data might be hidden     3. GAD (generalized anxiety disorder) She is on xanax 1mg g BID. Patient says she is doing well. GAD 7 : Generalized Anxiety Score 12/06/2019 12/21/2017 06/19/2017 12/21/2016  Nervous, Anxious, on Edge 2 1 2 2   Control/stop worrying 2 2 3 2   Worry too much - different things 2 1 3 2   Trouble relaxing 1 1 1 1   Restless 0 1 1 1   Easily annoyed or irritable 0 1 2 1   Afraid - awful might happen 1 1 2 1   Total GAD 7 Score 8 8 14 10   Anxiety Difficulty Somewhat difficult Somewhat difficult Somewhat difficult Somewhat difficult      4. BMI 32.0-32.9,adult No recent weight changes Wt Readings from Last 3 Encounters:  07/25/19 187 lb (84.8 kg)  07/23/19 185 lb 9.6 oz (84.2 kg)  10/26/18 195 lb (88.5 kg)   BMI Readings from Last 3 Encounters:  07/25/19 30.18 kg/m  07/23/19 29.96 kg/m  10/26/18 31.47 kg/m      Outpatient Encounter Medications as of 12/06/2019  Medication Sig  . ALPRAZolam (XANAX) 1 MG tablet Take 1 tablet (1 mg total) by mouth 2 (two) times daily.  Marland Kitchen amoxicillin-clavulanate (AUGMENTIN) 875-125 MG tablet Take 1 tablet by mouth 2 (two) times daily.  Marland Kitchen atorvastatin (LIPITOR) 40 MG tablet Take 1 tablet (  40 mg total) by mouth daily.  . benzonatate (TESSALON PERLES) 100 MG capsule Take 1 capsule (100 mg total) by mouth 3 (three) times daily as needed for cough.  . venlafaxine XR (EFFEXOR-XR) 150 MG 24 hr capsule Take 2 capsules (300 mg total) by mouth daily with breakfast.     Past Surgical History:  Procedure Laterality Date  . hemrrhoid      Family History  Problem Relation Age of Onset  . Cancer Father        stomach  . Diabetes Father   . Heart attack Father   . Heart disease Father   . Hyperlipidemia Brother     New complaints: None today  Social history: Lives with her husband- daughter is  still in jail  Controlled substance contract: 11/01/18- wil have new contract signed at next face to face visit    Review of Systems  Constitutional: Negative for diaphoresis and weight loss.  Eyes: Negative for blurred vision, double vision and pain.  Respiratory: Negative for shortness of breath.   Cardiovascular: Negative for chest pain, palpitations, orthopnea and leg swelling.  Gastrointestinal: Negative for abdominal pain.  Skin: Negative for rash.  Neurological: Negative for dizziness, sensory change, loss of consciousness, weakness and headaches.  Endo/Heme/Allergies: Negative for polydipsia. Does not bruise/bleed easily.  Psychiatric/Behavioral: Negative for memory loss. The patient does not have insomnia.   All other systems reviewed and are negative.    Observations/Objective: Alert and oriented- answers all questions appropriately No distress    Assessment and Plan: AVLEEN ATWAL comes in today with chief complaint of Medical Management of Chronic Issues   Diagnosis and orders addressed:  1. Mixed hyperlipidemia Low fat diet - atorvastatin (LIPITOR) 40 MG tablet; Take 1 tablet (40 mg total) by mouth daily.  Dispense: 90 tablet; Refill: 1  2. Recurrent major depressive disorder, in partial remission (H[facility]) Stress management - venlafaxine XR (EFFEXOR-XR) 150 MG 24 hr capsule; Take 2 capsules (300 mg total) by mouth daily with breakfast.  Dispense: 180 capsule; Refill: 1  3. GAD (generalized anxiety disorder) - ALPRAZolam (XANAX) 1 MG tablet; Take 1 tablet (1 mg total) by mouth 2 (two) times daily.  Dispense: 60 tablet; Refill: 2  4. BMI 32.0-32.9,adult Discussed diet and exercise for person with BMI >25 Will recheck weight in 3-6 months   Previous labs reviewed Health Maintenance reviewed Diet and exercise encouraged  Follow up plan: 3 months     I discussed the assessment and treatment plan with the patient. The patient was provided an opportunity  to ask questions and all were answered. The patient agreed with the plan and demonstrated an understanding of the instructions.   The patient was advised to call back or seek an in-person evaluation if the symptoms worsen or if the condition fails to improve as anticipated.  The above assessment and management plan was discussed with the patient. The patient verbalized understanding of and has agreed to the management plan. Patient is aware to call the clinic if symptoms persist or worsen. Patient is aware when to return to the clinic for a follow-up visit. Patient educated on when it is appropriate to go to the emergency department.   Time call ended:  2:25  I provided 15 minutes of non-face-to-face time during this encounter.    [name], FNP

## 2020-01-24 ENCOUNTER — Other Ambulatory Visit: Payer: Self-pay

## 2020-01-27 ENCOUNTER — Ambulatory Visit (INDEPENDENT_AMBULATORY_CARE_PROVIDER_SITE_OTHER): Payer: Medicare Other

## 2020-01-27 ENCOUNTER — Other Ambulatory Visit: Payer: Self-pay | Admitting: Nurse Practitioner

## 2020-01-27 ENCOUNTER — Encounter: Payer: Self-pay | Admitting: Nurse Practitioner

## 2020-01-27 ENCOUNTER — Other Ambulatory Visit: Payer: Self-pay

## 2020-01-27 ENCOUNTER — Ambulatory Visit: Payer: Medicare Other

## 2020-01-27 ENCOUNTER — Ambulatory Visit (INDEPENDENT_AMBULATORY_CARE_PROVIDER_SITE_OTHER): Payer: Medicare Other | Admitting: Nurse Practitioner

## 2020-01-27 ENCOUNTER — Ambulatory Visit: Payer: Self-pay | Admitting: Nurse Practitioner

## 2020-01-27 VITALS — BP 113/79 | HR 78 | Temp 98.4°F | Resp 20 | Ht 66.0 in | Wt 197.0 lb

## 2020-01-27 DIAGNOSIS — Z0000 Encounter for general adult medical examination without abnormal findings: Secondary | ICD-10-CM

## 2020-01-27 DIAGNOSIS — Z720 Tobacco use: Secondary | ICD-10-CM

## 2020-01-27 DIAGNOSIS — E782 Mixed hyperlipidemia: Secondary | ICD-10-CM | POA: Diagnosis not present

## 2020-01-27 DIAGNOSIS — Z780 Asymptomatic menopausal state: Secondary | ICD-10-CM

## 2020-01-27 DIAGNOSIS — F411 Generalized anxiety disorder: Secondary | ICD-10-CM

## 2020-01-27 DIAGNOSIS — Z13820 Encounter for screening for osteoporosis: Secondary | ICD-10-CM

## 2020-01-27 DIAGNOSIS — F3341 Major depressive disorder, recurrent, in partial remission: Secondary | ICD-10-CM | POA: Diagnosis not present

## 2020-01-27 DIAGNOSIS — Z6832 Body mass index (BMI) 32.0-32.9, adult: Secondary | ICD-10-CM | POA: Diagnosis not present

## 2020-01-27 MED ORDER — ATORVASTATIN CALCIUM 40 MG PO TABS: 40.0000 mg | ORAL_TABLET | Freq: Every day | ORAL | 1 refills | Status: DC

## 2020-01-27 MED ORDER — ALPRAZOLAM 1 MG PO TABS: 1.0000 mg | ORAL_TABLET | Freq: Two times a day (BID) | ORAL | 2 refills | Status: DC

## 2020-01-27 MED ORDER — VENLAFAXINE HCL ER 150 MG PO CP24: 300.0000 mg | ORAL_CAPSULE | Freq: Every day | ORAL | 1 refills | Status: DC

## 2020-01-27 NOTE — Progress Notes (Signed)
Subjective:    Patient ID: Brianna Sanchez, female    DOB: 1966/01/20, 54 y.o.   MRN: [id number]   Chief Complaint: annual physical   HPI:  1. Annual physical exam  no Pap today. Last  pap was in 2017 and was normal.  2. Mixed hyperlipidemia Does not watch diet and does very little exercise. Lab Results  Component Value Date   CHOL 151 07/25/2019   HDL 47 07/25/2019   LDLCALC 80 07/25/2019   TRIG 121 07/25/2019   CHOLHDL 3.2 07/25/2019     3. GAD (generalized anxiety disorder) Is on xanax BID. She is a very anxious person and is unable to work because of her anxiety. GAD 7 : Generalized Anxiety Score 01/27/2020 12/06/2019 12/21/2017 06/19/2017  Nervous, Anxious, on Edge _0 Control/stop worrying _1 Worry too much - different things _2 Trouble relaxing _3 Restless 0 0 1 1  Easily annoyed or irritable 1 0 1 2  Afraid - awful might happen _4 Total GAD 7 Score _5 Anxiety Difficulty Not difficult at all Somewhat difficult Somewhat difficult Somewhat difficult     4. Recurrent major depressive disorder, in partial remission ([place]) Is on effexor daily and has been for several years.  Depression screen [facility] 2/9 01/27/2020 12/06/2019 08/21/2019  Decreased Interest 0 1 0  Down, Depressed, Hopeless 0 2 0  PHQ - 2 Score 0 3 0  Altered sleeping - 1 -  Tired, decreased energy - 1 -  Change in appetite - 0 -  Feeling bad or failure about yourself  - 0 -  Trouble concentrating - 0 -  Moving slowly or fidgety/restless - 0 -  Suicidal thoughts - 0 -  PHQ-9 Score - 5 -  Difficult doing work/chores - Somewhat difficult -  Some recent data might be hidden     5. BMI 32.0-32.9,adult No recent weight changes  Wt Readings from Last 3 Encounters:  01/27/20 197 lb (89.4 kg)  07/25/19 187 lb (84.8 kg)  07/23/19 185 lb 9.6 oz (84.2 kg)   BMI Readings from Last 3 Encounters:  01/27/20 31.80 kg/m  07/25/19 30.18 kg/m  07/23/19 29.96 kg/m      Outpatient Encounter Medications as of 01/27/2020  Medication Sig  . ALPRAZolam (XANAX) 1 MG tablet Take 1 tablet (1 mg total) by mouth 2 (two) times daily.  Marland Kitchen atorvastatin (LIPITOR) 40 MG tablet Take 1 tablet (40 mg total) by mouth daily.  Marland Kitchen venlafaxine XR (EFFEXOR-XR) 150 MG 24 hr capsule Take 2 capsules (300 mg total) by mouth daily with breakfast.     Past Surgical History:  Procedure Laterality Date  . hemrrhoid      Family History  Problem Relation Age of Onset  . Cancer Father        stomach  . Diabetes Father   . Heart attack Father   . Heart disease Father   . Hyperlipidemia Brother     New complaints: None today  Social history: Lives with husband and son  Controlled substance contract: 01/27/20    Review of Systems  Constitutional: Negative for diaphoresis.  Eyes: Negative for pain.  Respiratory: Negative for shortness of breath.   Cardiovascular: Negative for chest pain, palpitations and leg swelling.  Gastrointestinal: Negative for abdominal pain.  Endocrine: Negative for polydipsia.  Skin: Negative for rash.  Neurological: Negative for dizziness,  weakness and headaches.  Hematological: Does not bruise/bleed easily.  All other systems reviewed and are negative.      Objective:   Physical Exam Vitals and nursing note reviewed.  Constitutional:      General: She is not in acute distress.    Appearance: Normal appearance. She is well-developed.  HENT:     Head: Normocephalic.     Nose: Nose normal.  Eyes:     Pupils: Pupils are equal, round, and reactive to light.  Neck:     Vascular: No carotid bruit or JVD.  Cardiovascular:     Rate and Rhythm: Normal rate and regular rhythm.     Heart sounds: Normal heart sounds.  Pulmonary:     Effort: Pulmonary effort is normal. No respiratory distress.     Breath sounds: Normal breath sounds. No wheezing or rales.  Chest:     Chest wall: No tenderness.  Abdominal:     General: Bowel sounds are  normal. There is no distension or abdominal bruit.     Palpations: Abdomen is soft. There is no hepatomegaly, splenomegaly, mass or pulsatile mass.     Tenderness: There is no abdominal tenderness.  Musculoskeletal:        General: Normal range of motion.     Cervical back: Normal range of motion and neck supple.  Lymphadenopathy:     Cervical: No cervical adenopathy.  Skin:    General: Skin is warm and dry.  Neurological:     Mental Status: She is alert and oriented to person, place, and time.     Deep Tendon Reflexes: Reflexes are normal and symmetric.  Psychiatric:        Behavior: Behavior normal.        Thought Content: Thought content normal.        Judgment: Judgment normal.    BP 113/79   Pulse 78   Temp 98.4 F (36.9 C) (Temporal)   Resp 20   Ht _0  (1.676 m)   Wt 197 lb (89.4 kg)   SpO2 96%   BMI 31.80 kg/m   Adella Nissen, FNP  chest x ray - mild bronchtic changes-Preliminary reading by Ronnald Collum, FNP  [facility]       Assessment & Plan:  Brianna Sanchez comes in today with chief complaint of No chief complaint on file.   Diagnosis and orders addressed:  1. Annual physical exam - CBC with Differential/Platelet - Thyroid Panel With TSH  2. Mixed hyperlipidemia Low fat diet - atorvastatin (LIPITOR) 40 MG tablet; Take 1 tablet (40 mg total) by mouth daily.  Dispense: 90 tablet; Refill: 1 - CMP14+EGFR - Lipid panel - EKG 12-Lead  3. GAD (generalized anxiety disorder) stress management - ALPRAZolam (XANAX) 1 MG tablet; Take 1 tablet (1 mg total) by mouth 2 (two) times daily.  Dispense: 60 tablet; Refill: 2  4. Recurrent major depressive disorder, in partial remission (HCC) - venlafaxine XR (EFFEXOR-XR) 150 MG 24 hr capsule; Take 2 capsules (300 mg total) by mouth daily with breakfast.  Dispense: 180 capsule; Refill: 1  5. BMI 32.0-32.9,adult Discussed diet and exercise for person with BMI >25 Will recheck weight in 3-6 months  6.  Tobacco abuse Smoking cessation encourgaed - DG Chest 2 View; Future  7. Screening for osteoporosis Weight bearing exercise - DG [facility] DEXA   Labs pending Health Maintenance reviewed Diet and exercise encouraged  Follow up plan: 6 months   [name], FNP

## 2020-01-27 NOTE — Patient Instructions (Signed)
Steps to Quit Smoking Smoking tobacco is the leading cause of preventable death. It can affect almost every organ in the body. Smoking puts you and people around you at risk for many serious, long-lasting (chronic) diseases. Quitting smoking can be hard, but it is one of the best things that you can do for your health. It is never too late to quit. How do I get ready to quit? When you decide to quit smoking, make a plan to help you succeed. Before you quit:  Pick a date to quit. Set a date within the next 2 weeks to give you time to prepare.  Write down the reasons why you are quitting. Keep this list in places where you will see it often.  Tell your family, friends, and co-workers that you are quitting. Their support is important.  Talk with your doctor about the choices that may help you quit.  Find out if your health insurance will pay for these treatments.  Know the people, places, things, and activities that make you want to smoke (triggers). Avoid them. What first steps can I take to quit smoking?  Throw away all cigarettes at home, at work, and in your car.  Throw away the things that you use when you smoke, such as ashtrays and lighters.  Clean your car. Make sure to empty the ashtray.  Clean your home, including curtains and carpets. What can I do to help me quit smoking? Talk with your doctor about taking medicines and seeing a counselor at the same time. You are more likely to succeed when you do both.  If you are pregnant or breastfeeding, talk with your doctor about counseling or other ways to quit smoking. Do not take medicine to help you quit smoking unless your doctor tells you to do so. To quit smoking: Quit right away  Quit smoking totally, instead of slowly cutting back on how much you smoke over a period of time.  Go to counseling. You are more likely to quit if you go to counseling sessions regularly. Take medicine You may take medicines to help you quit. Some  medicines need a prescription, and some you can buy over-the-counter. Some medicines may contain a drug called nicotine to replace the nicotine in cigarettes. Medicines may:  Help you to stop having the desire to smoke (cravings).  Help to stop the problems that come when you stop smoking (withdrawal symptoms). Your doctor may ask you to use:  Nicotine patches, gum, or lozenges.  Nicotine inhalers or sprays.  Non-nicotine medicine that is taken by mouth. Find resources Find resources and other ways to help you quit smoking and remain smoke-free after you quit. These resources are most helpful when you use them often. They include:  Online chats with a counselor.  Phone quitlines.  Printed self-help materials.  Support groups or group counseling.  Text messaging programs.  Mobile phone apps. Use apps on your mobile phone or tablet that can help you stick to your quit plan. There are many free apps for mobile phones and tablets as well as websites. Examples include Quit Guide from the CDC and smokefree.gov  What things can I do to make it easier to quit?   Talk to your family and friends. Ask them to support and encourage you.  Call a phone quitline (1-800-QUIT-NOW), reach out to support groups, or work with a counselor.  Ask people who smoke to not smoke around you.  Avoid places that make you want to smoke,   such as: ? Bars. ? Parties. ? Smoke-break areas at work.  Spend time with people who do not smoke.  Lower the stress in your life. Stress can make you want to smoke. Try these things to help your stress: ? Getting regular exercise. ? Doing deep-breathing exercises. ? Doing yoga. ? Meditating. ? Doing a body scan. To do this, close your eyes, focus on one area of your body at a time from head to toe. Notice which parts of your body are tense. Try to relax the muscles in those areas. How will I feel when I quit smoking? Day 1 to 3 weeks Within the first 24 hours,  you may start to have some problems that come from quitting tobacco. These problems are very bad 2-3 days after you quit, but they do not often last for more than 2-3 weeks. You may get these symptoms:  Mood swings.  Feeling restless, nervous, angry, or annoyed.  Trouble concentrating.  Dizziness.  Strong desire for high-sugar foods and nicotine.  Weight gain.  Trouble pooping (constipation).  Feeling like you may vomit (nausea).  Coughing or a sore throat.  Changes in how the medicines that you take for other issues work in your body.  Depression.  Trouble sleeping (insomnia). Week 3 and afterward After the first 2-3 weeks of quitting, you may start to notice more positive results, such as:  Better sense of smell and taste.  Less coughing and sore throat.  Slower heart rate.  Lower blood pressure.  Clearer skin.  Better breathing.  Fewer sick days. Quitting smoking can be hard. Do not give up if you fail the first time. Some people need to try a few times before they succeed. Do your best to stick to your quit plan, and talk with your doctor if you have any questions or concerns. Summary  Smoking tobacco is the leading cause of preventable death. Quitting smoking can be hard, but it is one of the best things that you can do for your health.  When you decide to quit smoking, make a plan to help you succeed.  Quit smoking right away, not slowly over a period of time.  When you start quitting, seek help from your doctor, family, or friends. This information is not intended to replace advice given to you by your health care provider. Make sure you discuss any questions you have with your health care provider. Document Revised: 08/30/2019 Document Reviewed: 02/23/2019 Elsevier Patient Education  2020 Elsevier Inc.  

## 2020-01-28 LAB — CBC WITH DIFFERENTIAL/PLATELET
Basophils Absolute: 0 10*3/uL (ref 0.0–0.2)
Basos: 1 %
EOS (ABSOLUTE): 0.1 10*3/uL (ref 0.0–0.4)
Eos: 1 %
Hematocrit: 42.8 % (ref 34.0–46.6)
Hemoglobin: 14.8 g/dL (ref 11.1–15.9)
Immature Grans (Abs): 0 10*3/uL (ref 0.0–0.1)
Immature Granulocytes: 0 %
Lymphocytes Absolute: 2.2 10*3/uL (ref 0.7–3.1)
Lymphs: 31 %
MCH: 31.5 pg (ref 26.6–33.0)
MCHC: 34.6 g/dL (ref 31.5–35.7)
MCV: 91 fL (ref 79–97)
Monocytes Absolute: 0.5 10*3/uL (ref 0.1–0.9)
Monocytes: 7 %
Neutrophils Absolute: 4.3 10*3/uL (ref 1.4–7.0)
Neutrophils: 60 %
Platelets: 366 10*3/uL (ref 150–450)
RBC: 4.7 x10E6/uL (ref 3.77–5.28)
RDW: 12.6 % (ref 11.7–15.4)
WBC: 7.1 10*3/uL (ref 3.4–10.8)

## 2020-01-28 LAB — LIPID PANEL
Chol/HDL Ratio: 3.3 ratio (ref 0.0–4.4)
Cholesterol, Total: 147 mg/dL (ref 100–199)
HDL: 44 mg/dL (ref >39)
LDL Chol Calc (NIH): 77 mg/dL (ref 0–99)
Triglycerides: 150 mg/dL — ABNORMAL HIGH (ref 0–149)
VLDL Cholesterol Cal: 26 mg/dL (ref 5–40)

## 2020-01-28 LAB — THYROID PANEL WITH TSH
Free Thyroxine Index: 1.8 (ref 1.2–4.9)
T3 Uptake Ratio: 22 % — ABNORMAL LOW (ref 24–39)
T4, Total: 8 ug/dL (ref 4.5–12.0)
TSH: 0.888 u[IU]/mL (ref 0.450–4.500)

## 2020-01-28 LAB — CMP14+EGFR
ALT: 24 IU/L (ref 0–32)
AST: 18 IU/L (ref 0–40)
Albumin/Globulin Ratio: 1.8 (ref 1.2–2.2)
Albumin: 4.5 g/dL (ref 3.8–4.9)
Alkaline Phosphatase: 109 IU/L (ref 39–117)
BUN/Creatinine Ratio: 18 (ref 9–23)
BUN: 12 mg/dL (ref 6–24)
Bilirubin Total: 0.3 mg/dL (ref 0.0–1.2)
CO2: 20 mmol/L (ref 20–29)
Calcium: 9.6 mg/dL (ref 8.7–10.2)
Chloride: 103 mmol/L (ref 96–106)
Creatinine, Ser: 0.67 mg/dL (ref 0.57–1.00)
GFR calc Af Amer: 115 mL/min/1.73 (ref >59)
GFR calc non Af Amer: 100 mL/min/1.73 (ref >59)
Globulin, Total: 2.5 g/dL (ref 1.5–4.5)
Glucose: 93 mg/dL (ref 65–99)
Potassium: 4.5 mmol/L (ref 3.5–5.2)
Sodium: 139 mmol/L (ref 134–144)
Total Protein: 7 g/dL (ref 6.0–8.5)

## 2020-01-28 NOTE — Addendum Note (Signed)
Addended by: Chevis Pretty on: 01/28/2020 07:50 AM   Modules accepted: Level of Service

## 2020-02-25 ENCOUNTER — Encounter: Payer: Self-pay | Admitting: Nurse Practitioner

## 2020-02-25 ENCOUNTER — Telehealth (INDEPENDENT_AMBULATORY_CARE_PROVIDER_SITE_OTHER): Payer: Medicare Other | Admitting: Nurse Practitioner

## 2020-02-25 DIAGNOSIS — B370 Candidal stomatitis: Secondary | ICD-10-CM | POA: Diagnosis not present

## 2020-02-25 MED ORDER — NYSTATIN 100000 UNIT/ML MT SUSP: 5.0000 mL | Freq: Four times a day (QID) | OROMUCOSAL | 0 refills | Status: DC

## 2020-02-25 NOTE — Progress Notes (Signed)
Virtual Visit via telephone Note Due to COVID-19 pandemic this visit was conducted virtually. This visit type was conducted due to national recommendations for restrictions regarding the COVID-19 Pandemic (e.g. social distancing, sheltering in place) in an effort to limit this patient's exposure and mitigate transmission in our community. All issues noted in this document were discussed and addressed.  A physical exam was not performed with this format.  I connected with Brianna Sanchez on 02/25/20 at 8:30 by telephone and verified that I am speaking with the correct person using two identifiers. Brianna Sanchez is currently located at home and no one is currently with her during visit. The provider, [name], FNP is located in their office at time of visit.  I discussed the limitations, risks, security and privacy concerns of performing an evaluation and management service by telephone and the availability of in person appointments. I also discussed with the patient that there may be a patient responsible charge related to this service. The patient expressed understanding and agreed to proceed.   History and Present Illness:   Chief Complaint: thrush  HPI Patient calls in stating that her mouth is really dry and when she looks in her mouth she sees bumps on the back of her tongue.she stopped smoking 2 weeks ago and still feels dry.    Review of Systems  Constitutional: Negative for diaphoresis and weight loss.  Eyes: Negative for blurred vision, double vision and pain.  Respiratory: Negative for shortness of breath.   Cardiovascular: Negative for chest pain, palpitations, orthopnea and leg swelling.  Gastrointestinal: Negative for abdominal pain.  Skin: Negative for rash.  Neurological: Negative for dizziness, sensory change, loss of consciousness, weakness and headaches.  Endo/Heme/Allergies: Negative for polydipsia. Does not bruise/bleed easily.    Psychiatric/Behavioral: Negative for memory loss. The patient does not have insomnia.   All other systems reviewed and are negative.    Observations/Objective: Alert and oriented- answers all questions appropriately No distress Could not see back of her tongue on video  Assessment and Plan: Brianna Sanchez in today with chief complaint of Thrush   1. Thrush Meds ordered this encounter  Medications  . nystatin (MYCOSTATIN) 100000 UNIT/ML suspension    Sig: Take 5 mLs (500,000 Units total) by mouth 4 (four) times daily.    Dispense:  60 mL    Refill:  0    Order Specific Question:   Supervising Provider    Answer:   Abran Cantor   Force fluids rto if no better in a couple of days     Follow Up Instructions: prn    I discussed the assessment and treatment plan with the patient. The patient was provided an opportunity to ask questions and all were answered. The patient agreed with the plan and demonstrated an understanding of the instructions.   The patient was advised to call back or seek an in-person evaluation if the symptoms worsen or if the condition fails to improve as anticipated.  The above assessment and management plan was discussed with the patient. The patient verbalized understanding of and has agreed to the management plan. Patient is aware to call the clinic if symptoms persist or worsen. Patient is aware when to return to the clinic for a follow-up visit. Patient educated on when it is appropriate to go to the emergency department.   Time call ended:  8:42  I provided 12 minutes of non-face-to-face time during this encounter.    [name], FNP

## 2020-03-02 ENCOUNTER — Ambulatory Visit: Payer: Self-pay | Admitting: Nurse Practitioner

## 2020-03-05 ENCOUNTER — Encounter: Payer: Self-pay | Admitting: Nurse Practitioner

## 2020-03-05 ENCOUNTER — Telehealth (INDEPENDENT_AMBULATORY_CARE_PROVIDER_SITE_OTHER): Payer: Medicare Other | Admitting: Nurse Practitioner

## 2020-03-05 DIAGNOSIS — B370 Candidal stomatitis: Secondary | ICD-10-CM

## 2020-03-05 MED ORDER — NYSTATIN 100000 UNIT/ML MT SUSP: 5.0000 mL | Freq: Four times a day (QID) | OROMUCOSAL | 0 refills | Status: DC

## 2020-03-05 MED ORDER — FLUCONAZOLE 150 MG PO TABS: 150.0000 mg | ORAL_TABLET | Freq: Once | ORAL | 0 refills | Status: AC

## 2020-03-05 NOTE — Progress Notes (Signed)
Virtual Visit via telephone Note Due to COVID-19 pandemic this visit was conducted virtually. This visit type was conducted due to national recommendations for restrictions regarding the COVID-19 Pandemic (e.g. social distancing, sheltering in place) in an effort to limit this patient's exposure and mitigate transmission in our community. All issues noted in this document were discussed and addressed.  A physical exam was not performed with this format.  I connected with Brianna Sanchez on 03/05/20 at 9:15 by telephone and verified that I am speaking with the correct person using two identifiers. Brianna Sanchez is currently located at home and no one  is currently with her during visit. The provider, [name], FNP is located in their office at time of visit.  I discussed the limitations, risks, security and privacy concerns of performing an evaluation and management service by telephone and the availability of in person appointments. I also discussed with the patient that there may be a patient responsible charge related to this service. The patient expressed understanding and agreed to proceed.   History and Present Illness:   Chief Complaint: Thrush   HPI Patient calls in today for another video visit. She was dx with thrush om 02/25/20. She used all the medicine and it got some better. Still has a film in the back of her throat and says feels Sanchez there is still something back there. She denies wanting to see specialist.     Review of Systems  Constitutional: Negative for diaphoresis and weight loss.  HENT: Positive for sore throat (feels raw).   Eyes: Negative for blurred vision, double vision and pain.  Respiratory: Negative for shortness of breath.   Cardiovascular: Negative for chest pain, palpitations, orthopnea and leg swelling.  Gastrointestinal: Negative for abdominal pain.  Skin: Negative for rash.  Neurological: Negative for dizziness, sensory change, loss of  consciousness, weakness and headaches.  Endo/Heme/Allergies: Negative for polydipsia. Does not bruise/bleed easily.  Psychiatric/Behavioral: Negative for memory loss. The patient does not have insomnia.   All other systems reviewed and are negative.    Observations/Objective: Alert and oriented- answers all questions appropriately No distress White film on back part of tongue    Assessment and Plan: Brianna Sanchez in today with chief complaint of Thrush   1. Thrush Continue to eat yogurt Makes sure to not only swish nystatin that you swallow it as well If does not improve will need to see specialist.  Meds ordered this encounter  Medications  . nystatin (MYCOSTATIN) 100000 UNIT/ML suspension    Sig: Take 5 mLs (500,000 Units total) by mouth 4 (four) times daily.    Dispense:  60 mL    Refill:  0    Order Specific Question:   Supervising Provider    Answer:   Caryl Pina A [id number]  . fluconazole (DIFLUCAN) 150 MG tablet    Sig: Take 1 tablet (150 mg total) by mouth once for 1 dose.    Dispense:  1 tablet    Refill:  0    Order Specific Question:   Supervising Provider    Answer:   Caryl Pina A [id number]        Follow Up Instructions: prn    I discussed the assessment and treatment plan with the patient. The patient was provided an opportunity to ask questions and all were answered. The patient agreed with the plan and demonstrated an understanding of the instructions.   The patient was advised to call back or seek an  in-person evaluation if the symptoms worsen or if the condition fails to improve as anticipated.  The above assessment and management plan was discussed with the patient. The patient verbalized understanding of and has agreed to the management plan. Patient is aware to call the clinic if symptoms persist or worsen. Patient is aware when to return to the clinic for a follow-up visit. Patient educated on when it is appropriate to go to the  emergency department.   Time call ended:  9:27  I provided 12 minutes of non-face-to-face time during this encounter.    [name], FNP

## 2020-03-06 ENCOUNTER — Telehealth: Payer: Self-pay | Admitting: Nurse Practitioner

## 2020-03-06 NOTE — Chronic Care Management (AMB) (Signed)
  Chronic Care Management   Outreach Note  03/06/2020 Name: Brianna Sanchez MRN: [id number] DOB: 04-Nov-1966  Brianna Sanchez is a 54 y.o. year old female who is a primary care patient of Chevis Pretty, [place]. I reached out to Brianna Sanchez by phone today in response to a referral sent by Brianna Sanchez's health plan.     An unsuccessful telephone outreach was attempted today. The patient was referred to the case management team for assistance with care management and care coordination.   Follow Up Plan: A HIPPA compliant phone message was left for the patient providing contact information and requesting a return call.  The care management team will reach out to the patient again over the next 7 days.  If patient returns call to provider office, please advise to call [place] at [phone].  [place], [place] [address] Direct Dial: [phone] [email or web address] Website: [email or web address]

## 2020-03-11 NOTE — Chronic Care Management (AMB) (Signed)
  Chronic Care Management   Note  03/11/2020 Name: Brianna Sanchez MRN: [id number] DOB: Nov 20, 1966  [name] is a 54 y.o. year old female who is a primary care patient of Chevis Pretty, [place]. I reached out to [name] by phone today in response to a referral sent by [name]'s health plan.     [name] was given information about Chronic Care Management services today including:  1. CCM service includes personalized support from designated clinical staff supervised by her physician, including individualized plan of care and coordination with other care providers 2. 24/7 contact phone numbers for assistance for urgent and routine care needs. 3. Service will only be billed when office clinical staff spend 20 minutes or more in a month to coordinate care. 4. Only one practitioner may furnish and bill the service in a calendar month. 5. The patient may stop CCM services at any time (effective at the end of the month) by phone call to the office staff. 6. The patient will be responsible for cost sharing (co-pay) of up to 20% of the service fee (after annual deductible is met).  Patient did not agree to enrollment in care management services and does not wish to consider at this time.  Follow up plan: The patient has been provided with contact information for the care management team and has been advised to call with any health related questions or concerns.   [place], [place] [address] Direct Dial: [phone] [email or web address] Website: [email or web address]

## 2020-06-01 ENCOUNTER — Other Ambulatory Visit: Payer: Self-pay | Admitting: Nurse Practitioner

## 2020-06-01 DIAGNOSIS — F411 Generalized anxiety disorder: Secondary | ICD-10-CM

## 2020-07-27 ENCOUNTER — Ambulatory Visit (INDEPENDENT_AMBULATORY_CARE_PROVIDER_SITE_OTHER): Payer: Medicare Other | Admitting: Nurse Practitioner

## 2020-07-27 ENCOUNTER — Encounter: Payer: Self-pay | Admitting: Nurse Practitioner

## 2020-07-27 ENCOUNTER — Other Ambulatory Visit: Payer: Self-pay

## 2020-07-27 ENCOUNTER — Other Ambulatory Visit: Payer: Self-pay | Admitting: Nurse Practitioner

## 2020-07-27 VITALS — BP 113/82 | HR 71 | Temp 97.8°F | Resp 20 | Ht 66.0 in | Wt 196.0 lb

## 2020-07-27 DIAGNOSIS — Z1231 Encounter for screening mammogram for malignant neoplasm of breast: Secondary | ICD-10-CM

## 2020-07-27 DIAGNOSIS — R946 Abnormal results of thyroid function studies: Secondary | ICD-10-CM

## 2020-07-27 DIAGNOSIS — Z6832 Body mass index (BMI) 32.0-32.9, adult: Secondary | ICD-10-CM | POA: Diagnosis not present

## 2020-07-27 DIAGNOSIS — E782 Mixed hyperlipidemia: Secondary | ICD-10-CM

## 2020-07-27 DIAGNOSIS — F3341 Major depressive disorder, recurrent, in partial remission: Secondary | ICD-10-CM | POA: Diagnosis not present

## 2020-07-27 DIAGNOSIS — F411 Generalized anxiety disorder: Secondary | ICD-10-CM

## 2020-07-27 MED ORDER — VENLAFAXINE HCL ER 150 MG PO CP24: 300.0000 mg | ORAL_CAPSULE | Freq: Every day | ORAL | 1 refills | Status: DC

## 2020-07-27 MED ORDER — ALPRAZOLAM 1 MG PO TABS: 1.0000 mg | ORAL_TABLET | Freq: Two times a day (BID) | ORAL | 5 refills | Status: DC

## 2020-07-27 MED ORDER — ATORVASTATIN CALCIUM 40 MG PO TABS: 40.0000 mg | ORAL_TABLET | Freq: Every day | ORAL | 1 refills | Status: DC

## 2020-07-27 NOTE — Progress Notes (Signed)
Subjective:    Patient ID: Brianna Sanchez, female    DOB: 14-Nov-1966, 54 y.o.   MRN: [id number]   Chief Complaint: Medical Management of Chronic Issues    HPI:  1. Mixed hyperlipidemia Does not watch diet and does little to no exercise. Lab Results  Component Value Date   CHOL 147 01/27/2020   HDL 44 01/27/2020   LDLCALC 77 01/27/2020   TRIG 150 (H) 01/27/2020   CHOLHDL 3.3 01/27/2020   The 10-year ASCVD risk score Mikey Bussing DC Jr., et al., 2013) is: 3.4%   Values used to calculate the score:     Age: 32 years     Sex: Female     Is Non-Hispanic African American: No     Diabetic: No     Tobacco smoker: Yes     Systolic Blood Pressure: 629 mmHg     Is BP treated: No     HDL Cholesterol: 44 mg/dL     Total Cholesterol: 147 mg/dL   2. Recurrent major depressive disorder, in partial remission [facility]) She has been on effexor for years and that is the only thing thatt had helped her. She says she is doing well. No medication side effects. Depression screen [facility] 2/9 07/27/2020 01/27/2020 12/06/2019  Decreased Interest 0 0 1  Down, Depressed, Hopeless 0 0 2  PHQ - 2 Score 0 0 3  Altered sleeping 0 - 1  Tired, decreased energy 0 - 1  Change in appetite 0 - 0  Feeling bad or failure about yourself  0 - 0  Trouble concentrating 0 - 0  Moving slowly or fidgety/restless 0 - 0  Suicidal thoughts 0 - 0  PHQ-9 Score 0 - 5  Difficult doing work/chores Not difficult at all - Somewhat difficult  Some recent data might be hidden     3. GAD (generalized anxiety disorder) Is on xanax 1mg  BID. She has been through a lot the last several years and cannot function without her xanax. GAD 7 : Generalized Anxiety Score 07/27/2020 01/27/2020 12/06/2019 12/21/2017  Nervous, Anxious, on Edge 3 1 2 1   Control/stop worrying 3 1 2 2   Worry too much - different things 3 1 2 1   Trouble relaxing 1 1 1 1   Restless 0 0 0 1  Easily annoyed or irritable 1 1 0 1  Afraid - awful might happen 3 3 1 1   Total GAD  7 Score 14 8 8 8   Anxiety Difficulty Not difficult at all Not difficult at all Somewhat difficult Somewhat difficult      4. BMI 32.0-32.9,adult No recent weight changes. Wt Readings from Last 3 Encounters:  07/27/20 196 lb (88.9 kg)  01/27/20 197 lb (89.4 kg)  07/25/19 187 lb (84.8 kg)   BMI Readings from Last 3 Encounters:  07/27/20 31.64 kg/m  01/27/20 31.80 kg/m  07/25/19 30.18 kg/m     Outpatient Encounter Medications as of 07/27/2020  Medication Sig   ALPRAZolam (XANAX) 1 MG tablet TAKE ONE TABLET TWICE A DAY.   atorvastatin (LIPITOR) 40 MG tablet Take 1 tablet (40 mg total) by mouth daily.   nystatin (MYCOSTATIN) 100000 UNIT/ML suspension Take 5 mLs (500,000 Units total) by mouth 4 (four) times daily.   venlafaxine XR (EFFEXOR-XR) 150 MG 24 hr capsule Take 2 capsules (300 mg total) by mouth daily with breakfast.    Past Surgical History:  Procedure Laterality Date   hemrrhoid      Family History  Problem Relation Age of  Onset   Cancer Father        stomach   Diabetes Father    Heart attack Father    Heart disease Father    Hyperlipidemia Brother     New complaints: None today  Social history: Lives with husband and son. Daughter gets out of jail in 2023  Controlled substance contract: 01/28/20    Review of Systems  Constitutional: Negative for diaphoresis.  Eyes: Negative for pain.  Respiratory: Negative for shortness of breath.   Cardiovascular: Negative for chest pain, palpitations and leg swelling.  Gastrointestinal: Negative for abdominal pain.  Endocrine: Negative for polydipsia.  Skin: Negative for rash.  Neurological: Negative for dizziness, weakness and headaches.  Hematological: Does not bruise/bleed easily.  All other systems reviewed and are negative.      Objective:   Physical Exam Vitals and nursing note reviewed.  Constitutional:      General: She is not in acute distress.    Appearance: Normal appearance. She is  well-developed.  HENT:     Head: Normocephalic.     Nose: Nose normal.  Eyes:     Pupils: Pupils are equal, round, and reactive to light.  Neck:     Vascular: No carotid bruit or JVD.  Cardiovascular:     Rate and Rhythm: Normal rate and regular rhythm.     Heart sounds: Normal heart sounds.  Pulmonary:     Effort: Pulmonary effort is normal. No respiratory distress.     Breath sounds: Normal breath sounds. No wheezing or rales.  Chest:     Chest wall: No tenderness.  Abdominal:     General: Bowel sounds are normal. There is no distension or abdominal bruit.     Palpations: Abdomen is soft. There is no hepatomegaly, splenomegaly, mass or pulsatile mass.     Tenderness: There is no abdominal tenderness.  Musculoskeletal:        General: Normal range of motion.     Cervical back: Normal range of motion and neck supple.  Lymphadenopathy:     Cervical: No cervical adenopathy.  Skin:    General: Skin is warm and dry.  Neurological:     Mental Status: She is alert and oriented to person, place, and time.     Deep Tendon Reflexes: Reflexes are normal and symmetric.  Psychiatric:        Behavior: Behavior normal.        Thought Content: Thought content normal.        Judgment: Judgment normal.    BP 113/82    Pulse 71    Temp 97.8 F (36.6 C) (Temporal)    Resp 20    Ht 5\' 6"  (1.676 m)    Wt 196 lb (88.9 kg)    SpO2 97%    BMI 31.64 kg/m         Assessment & Plan:  Brianna Sanchez comes in today with chief complaint of Medical Management of Chronic Issues   Diagnosis and orders addressed:  1. Mixed hyperlipidemia Low fat diet - atorvastatin (LIPITOR) 40 MG tablet; Take 1 tablet (40 mg total) by mouth daily.  Dispense: 90 tablet; Refill: 1  2. Recurrent major depressive disorder, in partial remission (HCC) Stress management - venlafaxine XR (EFFEXOR-XR) 150 MG 24 hr capsule; Take 2 capsules (300 mg total) by mouth daily with breakfast.  Dispense: 180 capsule; Refill:  1  3. GAD (generalized anxiety disorder) - ALPRAZolam (XANAX) 1 MG tablet; Take 1 tablet (1 mg  total) by mouth 2 (two) times daily.  Dispense: 60 tablet; Refill: 5  4. BMI 32.0-32.9,adult Discussed diet and exercise for person with BMI >25 Will recheck weight in 3-6 months    Labs pending Health Maintenance reviewed Diet and exercise encouraged  Follow up plan: 6 months   [name], FNP

## 2020-07-27 NOTE — Addendum Note (Signed)
Addended by: Rolena Infante on: 07/27/2020 09:04 AM   Modules accepted: Orders

## 2020-07-27 NOTE — Patient Instructions (Signed)
Stress, Adult Stress is a normal reaction to life events. Stress is what you feel when life demands more than you are used to, or more than you think you can handle. Some stress can be useful, such as studying for a test or meeting a deadline at work. Stress that occurs too often or for too long can cause problems. It can affect your emotional health and interfere with relationships and normal daily activities. Too much stress can weaken your body's defense system (immune system) and increase your risk for physical illness. If you already have a medical problem, stress can make it worse. What are the causes? All sorts of life events can cause stress. An event that causes stress for one person may not be stressful for another person. Major life events, whether positive or negative, commonly cause stress. Examples include:  Losing a job or starting a new job.  Losing a loved one.  Moving to a new town or home.  Getting married or divorced.  Having a baby.  Getting injured or sick. Less obvious life events can also cause stress, especially if they occur day after day or in combination with each other. Examples include:  Working long hours.  Driving in traffic.  Caring for children.  Being in debt.  Being in a difficult relationship. What are the signs or symptoms? Stress can cause emotional symptoms, including:  Anxiety. This is feeling worried, afraid, on edge, overwhelmed, or out of control.  Anger, including irritation or impatience.  Depression. This is feeling sad, down, helpless, or guilty.  Trouble focusing, remembering, or making decisions. Stress can cause physical symptoms, including:  Aches and pains. These may affect your head, neck, back, stomach, or other areas of your body.  Tight muscles or a clenched jaw.  Low energy.  Trouble sleeping. Stress can cause unhealthy behaviors, including:  Eating to feel better (overeating) or skipping meals.  Working too  much or putting off tasks.  Smoking, drinking alcohol, or using drugs to feel better. How is this diagnosed? Stress is diagnosed through an assessment by your health care provider. He or she may diagnose this condition based on:  Your symptoms and any stressful life events.  Your medical history.  Tests to rule out other causes of your symptoms. Depending on your condition, your health care provider may refer you to a specialist for further evaluation. How is this treated?  Stress management techniques are the recommended treatment for stress. Medicine is not typically recommended for the treatment of stress. Techniques to reduce your reaction to stressful life events include:  Stress identification. Monitor yourself for symptoms of stress and identify what causes stress for you. These skills may help you to avoid or prepare for stressful events.  Time management. Set your priorities, keep a calendar of events, and learn to say no. Taking these actions can help you avoid making too many commitments. Techniques for coping with stress include:  Rethinking the problem. Try to think realistically about stressful events rather than ignoring them or overreacting. Try to find the positives in a stressful situation rather than focusing on the negatives.  Exercise. Physical exercise can release both physical and emotional tension. The key is to find a form of exercise that you enjoy and do it regularly.  Relaxation techniques. These relax the body and mind. The key is to find one or more that you enjoy and use the techniques regularly. Examples include: ? Meditation, deep breathing, or progressive relaxation techniques. ? Yoga or   tai chi. ? Biofeedback, mindfulness techniques, or journaling. ? Listening to music, being out in nature, or participating in other hobbies.  Practicing a healthy lifestyle. Eat a balanced diet, drink plenty of water, limit or avoid caffeine, and get plenty of  sleep.  Having a strong support network. Spend time with family, friends, or other people you enjoy being around. Express your feelings and talk things over with someone you trust. Counseling or talk therapy with a mental health professional may be helpful if you are having trouble managing stress on your own. Follow these instructions at home: Lifestyle   Avoid drugs.  Do not use any products that contain nicotine or tobacco, such as cigarettes, e-cigarettes, and chewing tobacco. If you need help quitting, ask your health care provider.  Limit alcohol intake to no more than 1 drink a day for nonpregnant women and 2 drinks a day for men. One drink equals 12 oz of beer, 5 oz of wine, or 1 oz of hard liquor  Do not use alcohol or drugs to relax.  Eat a balanced diet that includes fresh fruits and vegetables, whole grains, lean meats, fish, eggs, and beans, and low-fat dairy. Avoid processed foods and foods high in added fat, sugar, and salt.  Exercise at least 30 minutes on 5 or more days each week.  Get 7-8 hours of sleep each night. General instructions   Practice stress management techniques as discussed with your health care provider.  Drink enough fluid to keep your urine clear or pale yellow.  Take over-the-counter and prescription medicines only as told by your health care provider.  Keep all follow-up visits as told by your health care provider. This is important. Contact a health care provider if:  Your symptoms get worse.  You have new symptoms.  You feel overwhelmed by your problems and can no longer manage them on your own. Get help right away if:  You have thoughts of hurting yourself or others. If you ever feel like you may hurt yourself or others, or have thoughts about taking your own life, get help right away. You can go to your nearest emergency department or call:  Your local emergency services (911 in the [place]  A suicide crisis helpline, such as the  [place] at (316) 250-6172. This is open 24 hours a day. Summary  Stress is a normal reaction to life events. It can cause problems if it happens too often or for too long.  Practicing stress management techniques is the best way to treat stress.  Counseling or talk therapy with a mental health professional may be helpful if you are having trouble managing stress on your own. This information is not intended to replace advice given to you by your health care provider. Make sure you discuss any questions you have with your health care provider. Document Revised: 07/05/2019 Document Reviewed: 01/25/2017 Elsevier Patient Education  [place].

## 2020-07-28 LAB — LIPID PANEL
Chol/HDL Ratio: 3 ratio (ref 0.0–4.4)
Cholesterol, Total: 141 mg/dL (ref 100–199)
HDL: 47 mg/dL (ref >39)
LDL Chol Calc (NIH): 66 mg/dL (ref 0–99)
Triglycerides: 162 mg/dL — ABNORMAL HIGH (ref 0–149)
VLDL Cholesterol Cal: 28 mg/dL (ref 5–40)

## 2020-07-28 LAB — CBC WITH DIFFERENTIAL/PLATELET
Basophils Absolute: 0 10*3/uL (ref 0.0–0.2)
Basos: 1 %
EOS (ABSOLUTE): 0.1 10*3/uL (ref 0.0–0.4)
Eos: 1 %
Hematocrit: 44.2 % (ref 34.0–46.6)
Hemoglobin: 14.8 g/dL (ref 11.1–15.9)
Immature Grans (Abs): 0 10*3/uL (ref 0.0–0.1)
Immature Granulocytes: 0 %
Lymphocytes Absolute: 2.1 10*3/uL (ref 0.7–3.1)
Lymphs: 32 %
MCH: 30.6 pg (ref 26.6–33.0)
MCHC: 33.5 g/dL (ref 31.5–35.7)
MCV: 92 fL (ref 79–97)
Monocytes Absolute: 0.5 10*3/uL (ref 0.1–0.9)
Monocytes: 7 %
Neutrophils Absolute: 4 10*3/uL (ref 1.4–7.0)
Neutrophils: 59 %
Platelets: 354 10*3/uL (ref 150–450)
RBC: 4.83 x10E6/uL (ref 3.77–5.28)
RDW: 12.3 % (ref 11.7–15.4)
WBC: 6.6 10*3/uL (ref 3.4–10.8)

## 2020-07-28 LAB — CMP14+EGFR
ALT: 20 IU/L (ref 0–32)
AST: 20 IU/L (ref 0–40)
Albumin/Globulin Ratio: 1.7 (ref 1.2–2.2)
Albumin: 4.6 g/dL (ref 3.8–4.9)
Alkaline Phosphatase: 116 IU/L (ref 48–121)
BUN/Creatinine Ratio: 13 (ref 9–23)
BUN: 10 mg/dL (ref 6–24)
Bilirubin Total: 0.3 mg/dL (ref 0.0–1.2)
CO2: 23 mmol/L (ref 20–29)
Calcium: 9.9 mg/dL (ref 8.7–10.2)
Chloride: 101 mmol/L (ref 96–106)
Creatinine, Ser: 0.75 mg/dL (ref 0.57–1.00)
GFR calc Af Amer: 104 mL/min/{1.73_m2} (ref >59)
GFR calc non Af Amer: 90 mL/min/{1.73_m2} (ref >59)
Globulin, Total: 2.7 g/dL (ref 1.5–4.5)
Glucose: 95 mg/dL (ref 65–99)
Potassium: 5.1 mmol/L (ref 3.5–5.2)
Sodium: 138 mmol/L (ref 134–144)
Total Protein: 7.3 g/dL (ref 6.0–8.5)

## 2020-07-28 LAB — THYROID PANEL WITH TSH
Free Thyroxine Index: 1.7 (ref 1.2–4.9)
T3 Uptake Ratio: 22 % — ABNORMAL LOW (ref 24–39)
T4, Total: 7.6 ug/dL (ref 4.5–12.0)
TSH: 1.16 u[IU]/mL (ref 0.450–4.500)

## 2020-09-16 ENCOUNTER — Ambulatory Visit
Admission: RE | Admit: 2020-09-16 | Discharge: 2020-09-16 | Disposition: A | Discharge status: Home / Self Care | Payer: Medicare Other | Source: Ambulatory Visit | Attending: Nurse Practitioner | Admitting: Nurse Practitioner

## 2020-09-16 ENCOUNTER — Other Ambulatory Visit: Payer: Self-pay

## 2020-09-16 DIAGNOSIS — Z1231 Encounter for screening mammogram for malignant neoplasm of breast: Secondary | ICD-10-CM

## 2021-01-29 ENCOUNTER — Other Ambulatory Visit (HOSPITAL_COMMUNITY)
Admission: RE | Admit: 2021-01-29 | Discharge: 2021-01-29 | Disposition: A | Discharge status: Home / Self Care | Payer: Medicare HMO | Source: Ambulatory Visit | Attending: Nurse Practitioner | Admitting: Nurse Practitioner

## 2021-01-29 ENCOUNTER — Other Ambulatory Visit: Payer: Self-pay

## 2021-01-29 ENCOUNTER — Encounter: Payer: Self-pay | Admitting: Nurse Practitioner

## 2021-01-29 ENCOUNTER — Ambulatory Visit (INDEPENDENT_AMBULATORY_CARE_PROVIDER_SITE_OTHER): Payer: Medicare HMO | Admitting: Nurse Practitioner

## 2021-01-29 VITALS — BP 115/80 | HR 69 | Temp 97.1°F | Resp 20 | Ht 66.0 in | Wt 203.0 lb

## 2021-01-29 DIAGNOSIS — Z6832 Body mass index (BMI) 32.0-32.9, adult: Secondary | ICD-10-CM | POA: Diagnosis not present

## 2021-01-29 DIAGNOSIS — F3341 Major depressive disorder, recurrent, in partial remission: Secondary | ICD-10-CM | POA: Diagnosis not present

## 2021-01-29 DIAGNOSIS — Z0001 Encounter for general adult medical examination with abnormal findings: Secondary | ICD-10-CM | POA: Diagnosis not present

## 2021-01-29 DIAGNOSIS — Z01419 Encounter for gynecological examination (general) (routine) without abnormal findings: Secondary | ICD-10-CM | POA: Diagnosis not present

## 2021-01-29 DIAGNOSIS — Z0000 Encounter for general adult medical examination without abnormal findings: Secondary | ICD-10-CM | POA: Insufficient documentation

## 2021-01-29 DIAGNOSIS — E782 Mixed hyperlipidemia: Secondary | ICD-10-CM | POA: Diagnosis not present

## 2021-01-29 DIAGNOSIS — F411 Generalized anxiety disorder: Secondary | ICD-10-CM | POA: Diagnosis not present

## 2021-01-29 DIAGNOSIS — Z23 Encounter for immunization: Secondary | ICD-10-CM

## 2021-01-29 LAB — URINALYSIS, COMPLETE
Bilirubin, UA: NEGATIVE
Glucose, UA: NEGATIVE
Ketones, UA: NEGATIVE
Leukocytes,UA: NEGATIVE
Nitrite, UA: NEGATIVE
Protein,UA: NEGATIVE
RBC, UA: NEGATIVE
Specific Gravity, UA: >1.03 — ABNORMAL HIGH (ref 1.005–1.030)
Urobilinogen, Ur: 0.2 mg/dL (ref 0.2–1.0)
pH, UA: 5.5 (ref 5.0–7.5)

## 2021-01-29 LAB — MICROSCOPIC EXAMINATION
RBC, Urine: NONE SEEN /hpf (ref 0–2)
WBC, UA: NONE SEEN /hpf (ref 0–5)

## 2021-01-29 MED ORDER — ALPRAZOLAM 1 MG PO TABS: 1.0000 mg | ORAL_TABLET | Freq: Two times a day (BID) | ORAL | 5 refills | Status: DC

## 2021-01-29 MED ORDER — VENLAFAXINE HCL ER 150 MG PO CP24: 300.0000 mg | ORAL_CAPSULE | Freq: Every day | ORAL | 1 refills | Status: DC

## 2021-01-29 MED ORDER — ATORVASTATIN CALCIUM 40 MG PO TABS: 40.0000 mg | ORAL_TABLET | Freq: Every day | ORAL | 1 refills | Status: DC

## 2021-01-29 NOTE — Progress Notes (Signed)
Subjective:    Patient ID: Brianna Sanchez, female    DOB: 1966-07-06, 55 y.o.   MRN: [id number]   Chief Complaint: Annual Exam    HPI:  1. Annual physical exam PAP today  2. Mixed hyperlipidemia Does not watch diet and does no dedicated exercise. Lab Results  Component Value Date   CHOL 141 07/27/2020   HDL 47 07/27/2020   LDLCALC 66 07/27/2020   TRIG 162 (H) 07/27/2020   CHOLHDL 3.0 07/27/2020   The 10-year ASCVD risk score Brianna Bussing DC Jr., et al., 2013) is: 3.2%   3. Recurrent major depressive disorder, in partial remission ([facility]) Is doing well on effexor. Depression screen [facility] 2/9 01/29/2021 07/27/2020 01/27/2020  Decreased Interest 1 0 0  Down, Depressed, Hopeless 1 0 0  PHQ - 2 Score 2 0 0  Altered sleeping 0 0 -  Tired, decreased energy 1 0 -  Change in appetite 1 0 -  Feeling bad or failure about yourself  1 0 -  Trouble concentrating 1 0 -  Moving slowly or fidgety/restless 0 0 -  Suicidal thoughts 0 0 -  PHQ-9 Score 6 0 -  Difficult doing work/chores Not difficult at all Not difficult at all -  Some recent data might be hidden     4. GAD (generalized anxiety disorder) Is on xanax BID and is doing well.  GAD 7 : Generalized Anxiety Score 01/29/2021 07/27/2020 01/27/2020 12/06/2019  Nervous, Anxious, on Edge '1 3 1 2  ' Control/stop worrying '1 3 1 2  ' Worry too much - different things '1 3 1 2  ' Trouble relaxing 0 '1 1 1  ' Restless 0 0 0 0  Easily annoyed or irritable '1 1 1 ' 0  Afraid - awful might happen '3 3 3 1  ' Total GAD 7 Score '7 14 8 8  ' Anxiety Difficulty Not difficult at all Not difficult at all Not difficult at all Somewhat difficult      5. BMI 32.0-32.9,adult No recent weight gain Wt Readings from Last 3 Encounters:  01/29/21 203 lb (92.1 kg)  07/27/20 196 lb (88.9 kg)  01/27/20 197 lb (89.4 kg)   BMI Readings from Last 3 Encounters:  01/29/21 32.77 kg/m  07/27/20 31.64 kg/m  01/27/20 31.80 kg/m       Outpatient Encounter Medications as of  01/29/2021  Medication Sig  . ALPRAZolam (XANAX) 1 MG tablet Take 1 tablet (1 mg total) by mouth 2 (two) times daily.  Marland Kitchen atorvastatin (LIPITOR) 40 MG tablet Take 1 tablet (40 mg total) by mouth daily.  Marland Kitchen venlafaxine XR (EFFEXOR-XR) 150 MG 24 hr capsule Take 2 capsules (300 mg total) by mouth daily with breakfast.     Past Surgical History:  Procedure Laterality Date  . hemrrhoid      Family History  Problem Relation Age of Onset  . Cancer Father        stomach  . Diabetes Father   . Heart attack Father   . Heart disease Father   . Hyperlipidemia Brother     New complaints: None today  Social history: Lives with husband and son  Controlled substance contract: n/a    Review of Systems  Constitutional: Negative for diaphoresis.  Eyes: Negative for pain.  Respiratory: Negative for shortness of breath.   Cardiovascular: Negative for chest pain, palpitations and leg swelling.  Gastrointestinal: Negative for abdominal pain.  Endocrine: Negative for polydipsia.  Skin: Negative for rash.  Neurological: Negative for dizziness, weakness and headaches.  Hematological: Does not bruise/bleed easily.  All other systems reviewed and are negative.      Objective:   Physical Exam Vitals and nursing note reviewed.  Constitutional:      General: She is not in acute distress.    Appearance: Normal appearance. She is well-developed and well-nourished.  HENT:     Head: Normocephalic.     Nose: Nose normal.     Mouth/Throat:     Mouth: Oropharynx is clear and moist.  Eyes:     Extraocular Movements: EOM normal.     Pupils: Pupils are equal, round, and reactive to light.  Neck:     Vascular: No carotid bruit or JVD.  Cardiovascular:     Rate and Rhythm: Normal rate and regular rhythm.     Pulses: Intact distal pulses.     Heart sounds: Normal heart sounds.  Pulmonary:     Effort: Pulmonary effort is normal. No respiratory distress.     Breath sounds: Normal breath sounds.  No wheezing or rales.  Chest:     Chest wall: No tenderness.  Abdominal:     General: Bowel sounds are normal. There is no distension or abdominal bruit. Aorta is normal.     Palpations: Abdomen is soft. There is no hepatomegaly, splenomegaly, mass or pulsatile mass.     Tenderness: There is no abdominal tenderness.  Genitourinary:    General: Normal vulva.     Vagina: No vaginal discharge.     Rectum: Normal.     Comments: Cervix parous and pink No adnexal masses or tenderness Musculoskeletal:        General: No edema. Normal range of motion.     Cervical back: Normal range of motion and neck supple.  Lymphadenopathy:     Cervical: No cervical adenopathy.  Skin:    General: Skin is warm and dry.  Neurological:     Mental Status: She is alert and oriented to person, place, and time.     Deep Tendon Reflexes: Reflexes are normal and symmetric.  Psychiatric:        Mood and Affect: Mood and affect normal.        Behavior: Behavior normal.        Thought Content: Thought content normal.        Judgment: Judgment normal.    BP 115/80   Pulse 69   Temp (!) 97.1 F (36.2 C) (Temporal)   Resp 20   Ht '5\' 6"'  (1.676 m)   Wt 203 lb (92.1 kg)   SpO2 97%   BMI 32.77 kg/m         Assessment & Plan:  [name] comes in today with chief complaint of Annual Exam   Diagnosis and orders addressed:  1. Annual physical exam - CBC with Differential/Platelet - Thyroid Panel With TSH - Cytology - PAP - Urinalysis, Complete  2. Mixed hyperlipidemia Low fat diet - atorvastatin (LIPITOR) 40 MG tablet; Take 1 tablet (40 mg total) by mouth daily.  Dispense: 90 tablet; Refill: 1 - CMP14+EGFR - Lipid panel  3. Recurrent major depressive disorder, in partial remission (HCC) Stress management - venlafaxine XR (EFFEXOR-XR) 150 MG 24 hr capsule; Take 2 capsules (300 mg total) by mouth daily with breakfast.  Dispense: 180 capsule; Refill: 1  4. GAD (generalized anxiety  disorder) - ALPRAZolam (XANAX) 1 MG tablet; Take 1 tablet (1 mg total) by mouth 2 (two) times daily.  Dispense: 60 tablet; Refill: 5  5. BMI 32.0-32.9,adult  Discussed diet and exercise for person with BMI >25 Will recheck weight in 3-6 months    Labs pending Health Maintenance reviewed Diet and exercise encouraged  Follow up plan: 6 months   [name], FNP

## 2021-01-29 NOTE — Patient Instructions (Signed)
Exercising to Stay Healthy To become healthy and stay healthy, it is recommended that you do moderate-intensity and vigorous-intensity exercise. You can tell that you are exercising at a moderate intensity if your heart starts beating faster and you start breathing faster but can still hold a conversation. You can tell that you are exercising at a vigorous intensity if you are breathing much harder and faster and cannot hold a conversation while exercising. Exercising regularly is important. It has many health benefits, such as:  Improving overall fitness, flexibility, and endurance.  Increasing bone density.  Helping with weight control.  Decreasing body fat.  Increasing muscle strength.  Reducing stress and tension.  Improving overall health. How often should I exercise? Choose an activity that you enjoy, and set realistic goals. Your health care provider can help you make an activity plan that works for you. Exercise regularly as told by your health care provider. This may include:  Doing strength training two times a week, such as: ? Lifting weights. ? Using resistance bands. ? Push-ups. ? Sit-ups. ? Yoga.  Doing a certain intensity of exercise for a given amount of time. Choose from these options: ? A total of 150 minutes of moderate-intensity exercise every week. ? A total of 75 minutes of vigorous-intensity exercise every week. ? A mix of moderate-intensity and vigorous-intensity exercise every week. Children, pregnant women, people who have not exercised regularly, people who are overweight, and older adults may need to talk with a health care provider about what activities are safe to do. If you have a medical condition, be sure to talk with your health care provider before you start a new exercise program. What are some exercise ideas? Moderate-intensity exercise ideas include:  Walking 1 mile (1.6 km) in about 15  minutes.  Biking.  Hiking.  Golfing.  Dancing.  Water aerobics. Vigorous-intensity exercise ideas include:  Walking 4.5 miles (7.2 km) or more in about 1 hour.  Jogging or running 5 miles (8 km) in about 1 hour.  Biking 10 miles (16.1 km) or more in about 1 hour.  Lap swimming.  Roller-skating or in-line skating.  Cross-country skiing.  Vigorous competitive sports, such as football, basketball, and soccer.  Jumping rope.  Aerobic dancing.   What are some everyday activities that can help me to get exercise?  Yard work, such as: ? Pushing a lawn mower. ? Raking and bagging leaves.  Washing your car.  Pushing a stroller.  Shoveling snow.  Gardening.  Washing windows or floors. How can I be more active in my day-to-day activities?  Use stairs instead of an elevator.  Take a walk during your lunch break.  If you drive, park your car farther away from your work or school.  If you take public transportation, get off one stop early and walk the rest of the way.  Stand up or walk around during all of your indoor phone calls.  Get up, stretch, and walk around every 30 minutes throughout the day.  Enjoy exercise with a friend. Support to continue exercising will help you keep a regular routine of activity. What guidelines can I follow while exercising?  Before you start a new exercise program, talk with your health care provider.  Do not exercise so much that you hurt yourself, feel dizzy, or get very short of breath.  Wear comfortable clothes and wear shoes with good support.  Drink plenty of water while you exercise to prevent dehydration or heat stroke.  Work out until   your breathing and your heartbeat get faster. Where to find more information  [place] Department of Health and Human Services: www.hhs.gov  Centers for Disease Control and Prevention (CDC): www.cdc.gov Summary  Exercising regularly is important. It will improve your overall fitness,  flexibility, and endurance.  Regular exercise also will improve your overall health. It can help you control your weight, reduce stress, and improve your bone density.  Do not exercise so much that you hurt yourself, feel dizzy, or get very short of breath.  Before you start a new exercise program, talk with your health care provider. This information is not intended to replace advice given to you by your health care provider. Make sure you discuss any questions you have with your health care provider. Document Revised: 11/17/2017 Document Reviewed: 10/26/2017 Elsevier Patient Education  2021 [employer] Inc.  

## 2021-01-30 LAB — CBC WITH DIFFERENTIAL/PLATELET
Basophils Absolute: 0 10*3/uL (ref 0.0–0.2)
Basos: 1 %
EOS (ABSOLUTE): 0.1 10*3/uL (ref 0.0–0.4)
Eos: 2 %
Hematocrit: 42.3 % (ref 34.0–46.6)
Hemoglobin: 14 g/dL (ref 11.1–15.9)
Immature Grans (Abs): 0 10*3/uL (ref 0.0–0.1)
Immature Granulocytes: 0 %
Lymphocytes Absolute: 2.2 10*3/uL (ref 0.7–3.1)
Lymphs: 39 %
MCH: 29.6 pg (ref 26.6–33.0)
MCHC: 33.1 g/dL (ref 31.5–35.7)
MCV: 89 fL (ref 79–97)
Monocytes Absolute: 0.5 10*3/uL (ref 0.1–0.9)
Monocytes: 10 %
Neutrophils Absolute: 2.8 10*3/uL (ref 1.4–7.0)
Neutrophils: 48 %
Platelets: 366 10*3/uL (ref 150–450)
RBC: 4.73 x10E6/uL (ref 3.77–5.28)
RDW: 12.5 % (ref 11.7–15.4)
WBC: 5.6 10*3/uL (ref 3.4–10.8)

## 2021-01-30 LAB — LIPID PANEL
Chol/HDL Ratio: 3.3 ratio (ref 0.0–4.4)
Cholesterol, Total: 154 mg/dL (ref 100–199)
HDL: 47 mg/dL (ref >39)
LDL Chol Calc (NIH): 81 mg/dL (ref 0–99)
Triglycerides: 149 mg/dL (ref 0–149)
VLDL Cholesterol Cal: 26 mg/dL (ref 5–40)

## 2021-01-30 LAB — CMP14+EGFR
ALT: 25 IU/L (ref 0–32)
AST: 16 IU/L (ref 0–40)
Albumin/Globulin Ratio: 1.9 (ref 1.2–2.2)
Albumin: 4.5 g/dL (ref 3.8–4.9)
Alkaline Phosphatase: 90 IU/L (ref 44–121)
BUN/Creatinine Ratio: 20 (ref 9–23)
BUN: 14 mg/dL (ref 6–24)
Bilirubin Total: 0.3 mg/dL (ref 0.0–1.2)
CO2: 21 mmol/L (ref 20–29)
Calcium: 9.4 mg/dL (ref 8.7–10.2)
Chloride: 103 mmol/L (ref 96–106)
Creatinine, Ser: 0.71 mg/dL (ref 0.57–1.00)
GFR calc Af Amer: 111 mL/min/{1.73_m2} (ref >59)
GFR calc non Af Amer: 96 mL/min/{1.73_m2} (ref >59)
Globulin, Total: 2.4 g/dL (ref 1.5–4.5)
Glucose: 94 mg/dL (ref 65–99)
Potassium: 4.6 mmol/L (ref 3.5–5.2)
Sodium: 141 mmol/L (ref 134–144)
Total Protein: 6.9 g/dL (ref 6.0–8.5)

## 2021-01-30 LAB — THYROID PANEL WITH TSH
Free Thyroxine Index: 1.7 (ref 1.2–4.9)
T3 Uptake Ratio: 22 % — ABNORMAL LOW (ref 24–39)
T4, Total: 7.6 ug/dL (ref 4.5–12.0)
TSH: 1.25 u[IU]/mL (ref 0.450–4.500)

## 2021-02-01 LAB — CYTOLOGY - PAP
Adequacy: ABSENT
Diagnosis: NEGATIVE

## 2021-02-13 IMAGING — DX DG CHEST 2V
2 series · 2 of 2 positions shown · non-contrast
Comparison: 04/23/2015

CLINICAL DATA: Tobacco abuse.

EXAM:
CHEST - 2 VIEW

[chest pa]
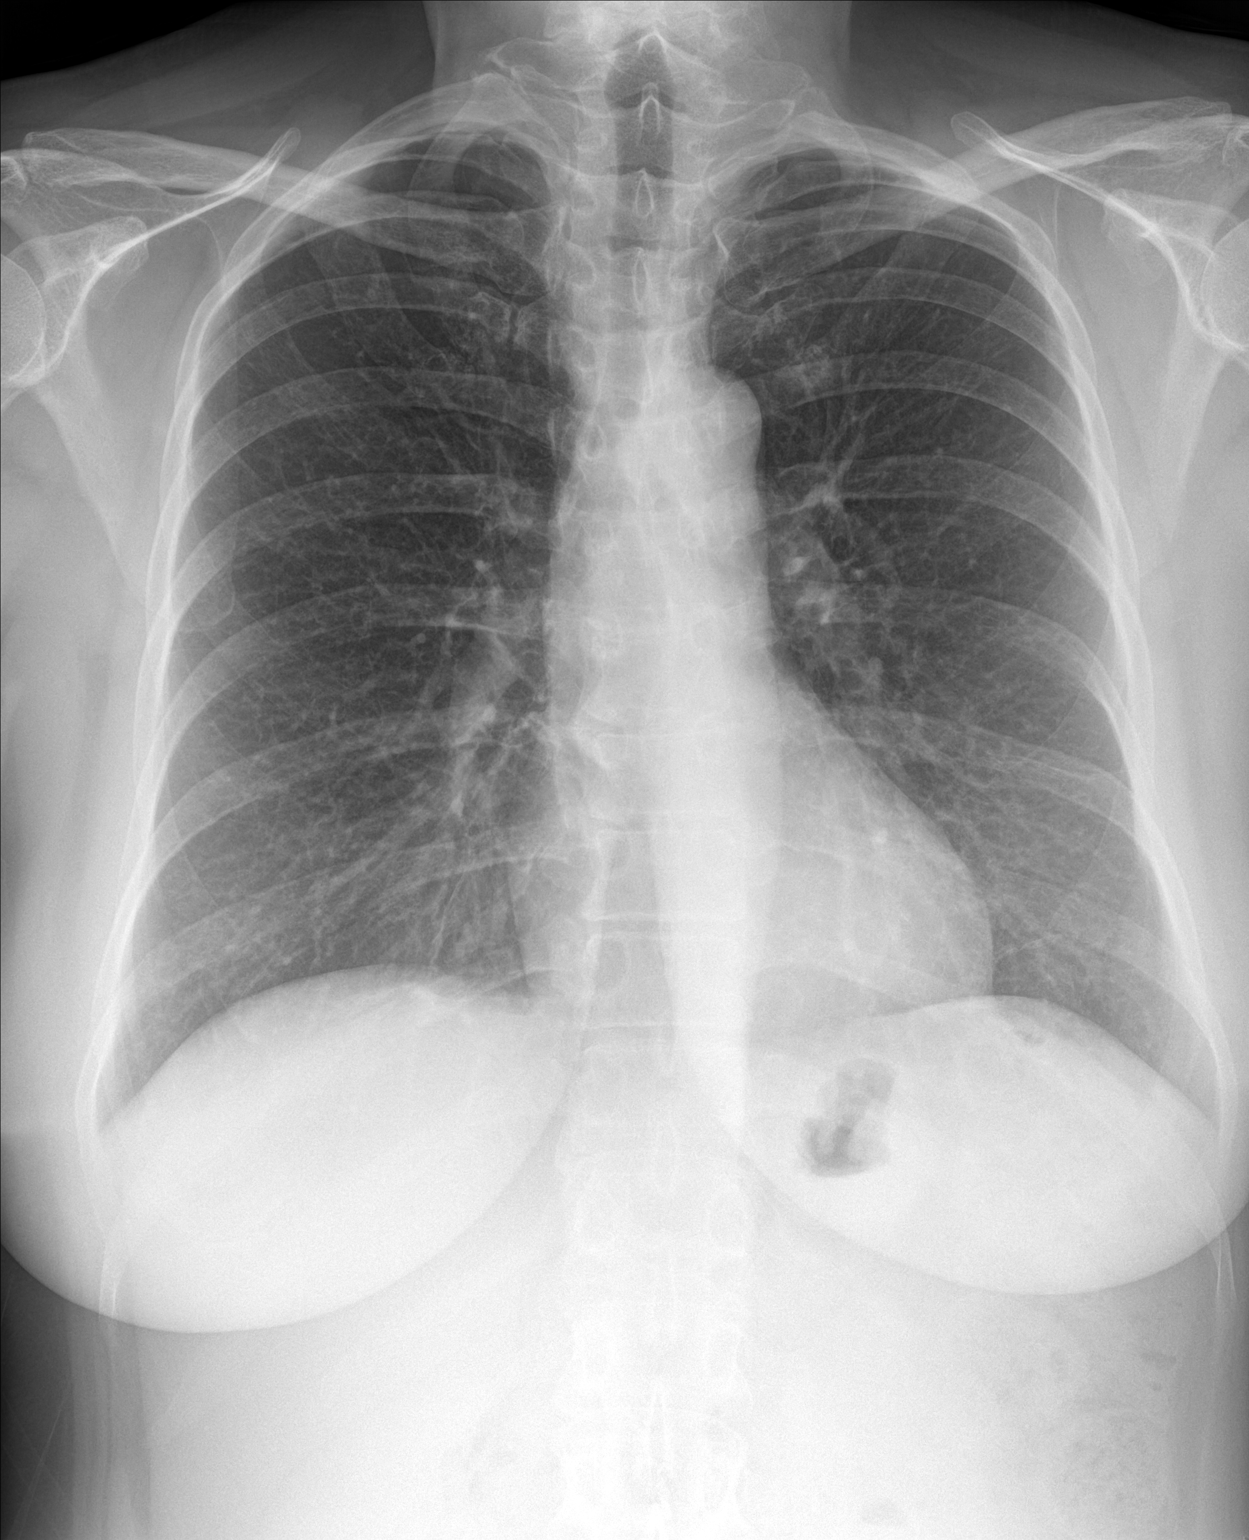

[chest lat]
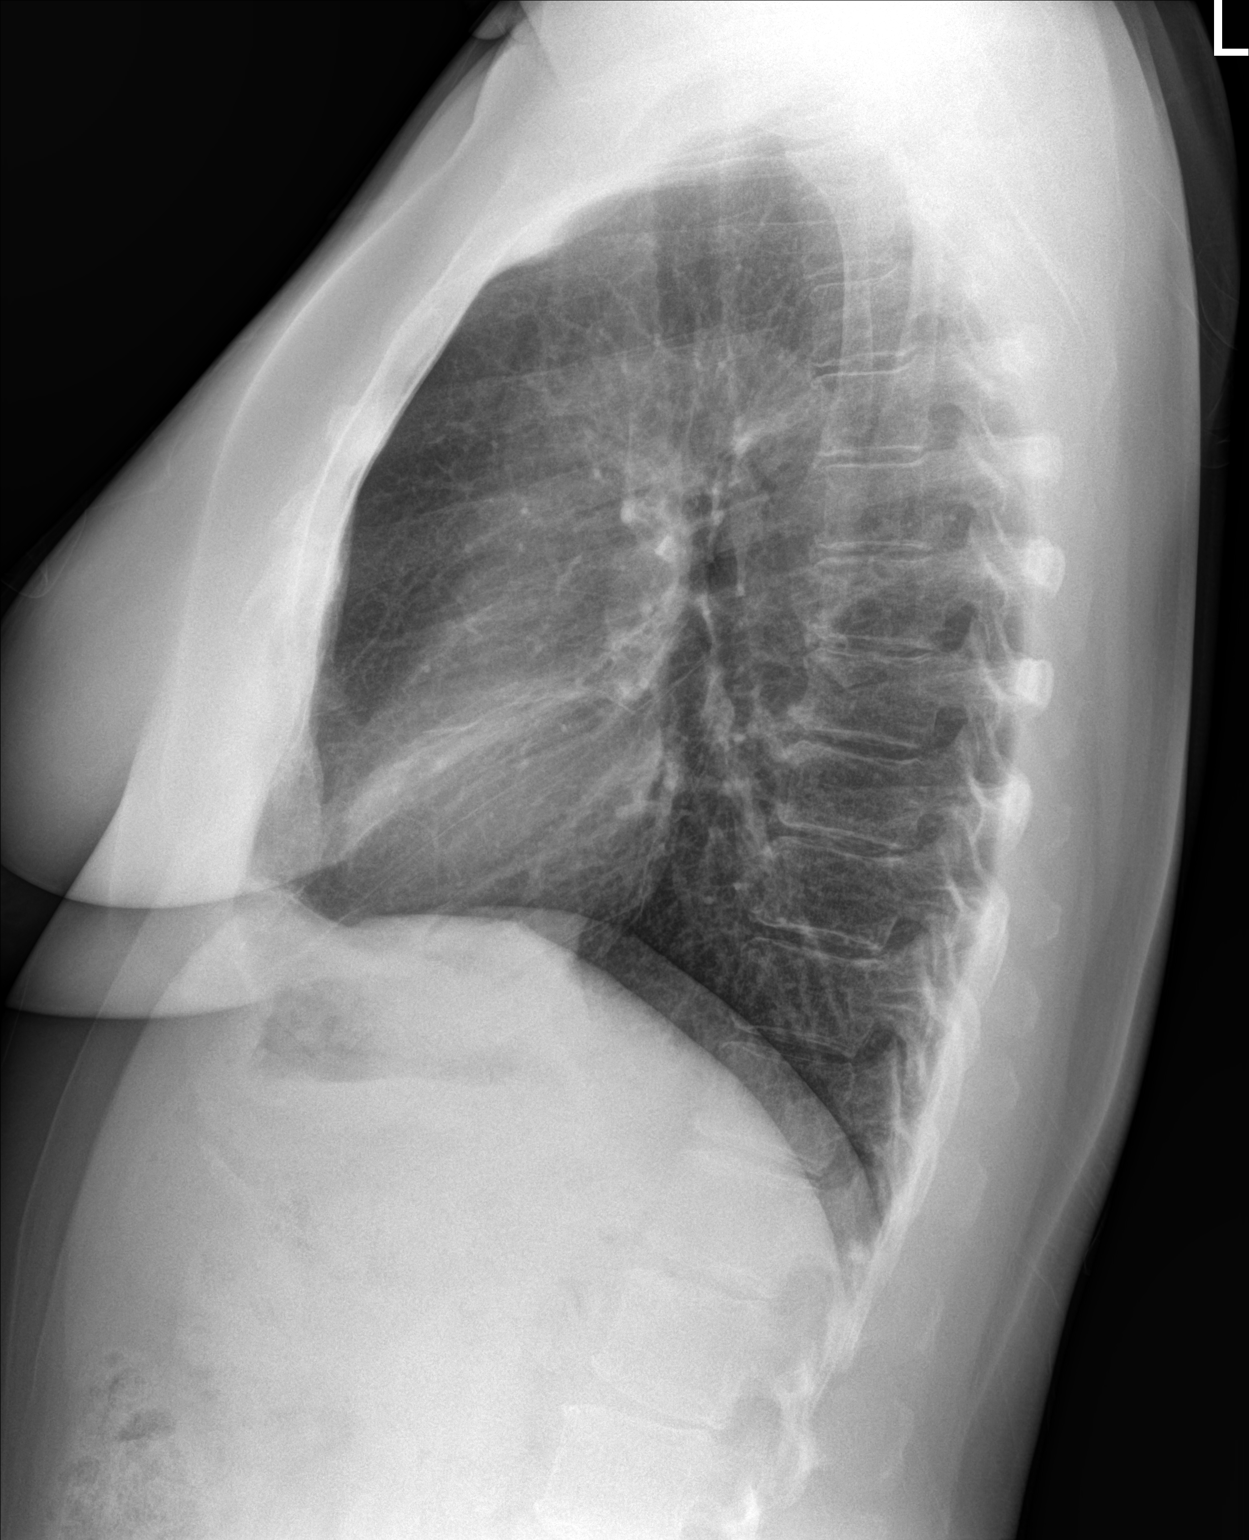

[2 of 2 positions shown; findings below may reference images not displayed]

FINDINGS: The cardiac silhouette, mediastinal and hilar contours are normal.
The lungs are clear. No pleural effusions. The bony thorax is
intact.
IMPRESSION: No acute cardiopulmonary findings.

## 2021-07-29 ENCOUNTER — Ambulatory Visit (INDEPENDENT_AMBULATORY_CARE_PROVIDER_SITE_OTHER): Payer: Medicare HMO | Admitting: Nurse Practitioner

## 2021-07-29 ENCOUNTER — Encounter: Payer: Self-pay | Admitting: Nurse Practitioner

## 2021-07-29 ENCOUNTER — Other Ambulatory Visit: Payer: Self-pay

## 2021-07-29 VITALS — BP 111/77 | HR 69 | Temp 97.3°F | Resp 20 | Ht 66.0 in | Wt 194.0 lb

## 2021-07-29 DIAGNOSIS — F411 Generalized anxiety disorder: Secondary | ICD-10-CM

## 2021-07-29 DIAGNOSIS — Z6831 Body mass index (BMI) 31.0-31.9, adult: Secondary | ICD-10-CM | POA: Diagnosis not present

## 2021-07-29 DIAGNOSIS — E782 Mixed hyperlipidemia: Secondary | ICD-10-CM

## 2021-07-29 DIAGNOSIS — F3341 Major depressive disorder, recurrent, in partial remission: Secondary | ICD-10-CM

## 2021-07-29 LAB — CBC WITH DIFFERENTIAL/PLATELET
Basophils Absolute: 0 10*3/uL (ref 0.0–0.2)
Basos: 1 %
EOS (ABSOLUTE): 0.1 10*3/uL (ref 0.0–0.4)
Eos: 2 %
Hematocrit: 42.4 % (ref 34.0–46.6)
Hemoglobin: 13.8 g/dL (ref 11.1–15.9)
Immature Grans (Abs): 0 10*3/uL (ref 0.0–0.1)
Immature Granulocytes: 0 %
Lymphocytes Absolute: 1.6 10*3/uL (ref 0.7–3.1)
Lymphs: 30 %
MCH: 29.7 pg (ref 26.6–33.0)
MCHC: 32.5 g/dL (ref 31.5–35.7)
MCV: 91 fL (ref 79–97)
Monocytes Absolute: 0.4 10*3/uL (ref 0.1–0.9)
Monocytes: 8 %
Neutrophils Absolute: 3.1 10*3/uL (ref 1.4–7.0)
Neutrophils: 59 %
Platelets: 358 10*3/uL (ref 150–450)
RBC: 4.65 x10E6/uL (ref 3.77–5.28)
RDW: 13.2 % (ref 11.7–15.4)
WBC: 5.2 10*3/uL (ref 3.4–10.8)

## 2021-07-29 LAB — CMP14+EGFR
ALT: 26 IU/L (ref 0–32)
AST: 21 IU/L (ref 0–40)
Albumin/Globulin Ratio: 2.1 (ref 1.2–2.2)
Albumin: 4.7 g/dL (ref 3.8–4.9)
Alkaline Phosphatase: 103 IU/L (ref 44–121)
BUN/Creatinine Ratio: 17 (ref 9–23)
BUN: 12 mg/dL (ref 6–24)
Bilirubin Total: 0.3 mg/dL (ref 0.0–1.2)
CO2: 25 mmol/L (ref 20–29)
Calcium: 9.9 mg/dL (ref 8.7–10.2)
Chloride: 102 mmol/L (ref 96–106)
Creatinine, Ser: 0.71 mg/dL (ref 0.57–1.00)
Globulin, Total: 2.2 g/dL (ref 1.5–4.5)
Glucose: 116 mg/dL — ABNORMAL HIGH (ref 65–99)
Potassium: 5.2 mmol/L (ref 3.5–5.2)
Sodium: 139 mmol/L (ref 134–144)
Total Protein: 6.9 g/dL (ref 6.0–8.5)
eGFR: 100 mL/min/{1.73_m2} (ref >59)

## 2021-07-29 LAB — LIPID PANEL
Chol/HDL Ratio: 3.3 ratio (ref 0.0–4.4)
Cholesterol, Total: 153 mg/dL (ref 100–199)
HDL: 47 mg/dL (ref >39)
LDL Chol Calc (NIH): 83 mg/dL (ref 0–99)
Triglycerides: 129 mg/dL (ref 0–149)
VLDL Cholesterol Cal: 23 mg/dL (ref 5–40)

## 2021-07-29 MED ORDER — ATORVASTATIN CALCIUM 40 MG PO TABS
40.0000 mg | ORAL_TABLET | Freq: Every day | ORAL | 1 refills | Status: DC
Start: 2021-07-29 — End: 2022-01-31

## 2021-07-29 MED ORDER — ALPRAZOLAM 1 MG PO TABS: 1.0000 mg | ORAL_TABLET | Freq: Two times a day (BID) | ORAL | 5 refills | Status: DC

## 2021-07-29 MED ORDER — VENLAFAXINE HCL ER 150 MG PO CP24: 300.0000 mg | ORAL_CAPSULE | Freq: Every day | ORAL | 1 refills | Status: DC

## 2021-07-29 NOTE — Patient Instructions (Signed)
Textbook of family medicine (9th ed., pp. 1062-1073). [place], [place]: [employer].">  Stress, Adult Stress is a normal reaction to life events. Stress is what you feel when life demands more than you are used to, or more than you think you can handle. Some stress can be useful, such as studying for a test or meeting a deadline at work. Stress that occurs too often or for too long can cause problems. It can affect your emotional health and interfere with relationships and normal daily activities. Too much stress can weaken your body's defense system (immune system) and increase your risk for physical illness. If you already have a medicalproblem, stress can make it worse. What are the causes? All sorts of life events can cause stress. An event that causes stress for one person may not be stressful for another person. Major life events, whether positive or negative, commonly cause stress. Examples include: Losing a job or starting a new job. Losing a loved one. Moving to a new town or home. Getting married or divorced. Having a baby. Getting injured or sick. Less obvious life events can also cause stress, especially if they occur day after day or in combination with each other. Examples include: Working long hours. Driving in traffic. Caring for children. Being in debt. Being in a difficult relationship. What are the signs or symptoms? Stress can cause emotional symptoms, including: Anxiety. This is feeling worried, afraid, on edge, overwhelmed, or out of control. Anger, including irritation or impatience. Depression. This is feeling sad, down, helpless, or guilty. Trouble focusing, remembering, or making decisions. Stress can cause physical symptoms, including: Aches and pains. These may affect your head, neck, back, stomach, or other areas of your body. Tight muscles or a clenched jaw. Low energy. Trouble sleeping. Stress can cause unhealthy behaviors, including: Eating to feel better  (overeating) or skipping meals. Working too much or putting off tasks. Smoking, drinking alcohol, or using drugs to feel better. How is this diagnosed? Stress is diagnosed through an assessment by your health care provider. He or she may diagnose this condition based on: Your symptoms and any stressful life events. Your medical history. Tests to rule out other causes of your symptoms. Depending on your condition, your health care provider may refer you to aspecialist for further evaluation. How is this treated?  Stress management techniques are the recommended treatment for stress. Medicineis not typically recommended for the treatment of stress. Techniques to reduce your reaction to stressful life events include: Stress identification. Monitor yourself for symptoms of stress and identify what causes stress for you. These skills may help you to avoid or prepare for stressful events. Time management. Set your priorities, keep a calendar of events, and learn to say no. Taking these actions can help you avoid making too many commitments. Techniques for coping with stress include: Rethinking the problem. Try to think realistically about stressful events rather than ignoring them or overreacting. Try to find the positives in a stressful situation rather than focusing on the negatives. Exercise. Physical exercise can release both physical and emotional tension. The key is to find a form of exercise that you enjoy and do it regularly. Relaxation techniques. These relax the body and mind. The key is to find one or more that you enjoy and use the techniques regularly. Examples include: Meditation, deep breathing, or progressive relaxation techniques. Yoga or tai chi. Biofeedback, mindfulness techniques, or journaling. Listening to music, being out in nature, or participating in other hobbies. Practicing a healthy lifestyle.   Eat a balanced diet, drink plenty of water, limit or avoid caffeine, and get  plenty of sleep. Having a strong support network. Spend time with family, friends, or other people you enjoy being around. Express your feelings and talk things over with someone you trust. Counseling or talk therapy with a mental health professional may be helpful if you are havingtrouble managing stress on your own. Follow these instructions at home: Lifestyle  Avoid drugs. Do not use any products that contain nicotine or tobacco, such as cigarettes, e-cigarettes, and chewing tobacco. If you need help quitting, ask your health care provider. Limit alcohol intake to no more than 1 drink a day for nonpregnant women and 2 drinks a day for men. One drink equals 12 oz of beer, 5 oz of wine, or 1 oz of hard liquor Do not use alcohol or drugs to relax. Eat a balanced diet that includes fresh fruits and vegetables, whole grains, lean meats, fish, eggs, and beans, and low-fat dairy. Avoid processed foods and foods high in added fat, sugar, and salt. Exercise at least 30 minutes on 5 or more days each week. Get 7-8 hours of sleep each night.  General instructions  Practice stress management techniques as discussed with your health care provider. Drink enough fluid to keep your urine clear or pale yellow. Take over-the-counter and prescription medicines only as told by your health care provider. Keep all follow-up visits as told by your health care provider. This is important.  Contact a health care provider if: Your symptoms get worse. You have new symptoms. You feel overwhelmed by your problems and can no longer manage them on your own. Get help right away if: You have thoughts of hurting yourself or others. If you ever feel like you may hurt yourself or others, or have thoughts about taking your own life, get help right away. You can go to your nearest emergency department or call: Your local emergency services (911 in the [place] A suicide crisis helpline, such as the [facility] at 4253100524. This is open 24 hours a day. Summary Stress is a normal reaction to life events. It can cause problems if it happens too often or for too long. Practicing stress management techniques is the best way to treat stress. Counseling or talk therapy with a mental health professional may be helpful if you are having trouble managing stress on your own. This information is not intended to replace advice given to you by your health care provider. Make sure you discuss any questions you have with your healthcare provider. Document Revised: 08/21/2020 Document Reviewed: 08/21/2020 Elsevier Patient Education  2022 [employer].

## 2021-07-29 NOTE — Addendum Note (Signed)
Addended by: Ladean Raya on: 07/29/2021 08:47 AM   Modules accepted: Orders

## 2021-07-29 NOTE — Progress Notes (Signed)
Subjective:    Patient ID: Brianna Sanchez, female    DOB: 1966-07-09, 55 y.o.   MRN: [id number]  Chief Complaint: medical management of chronic issues     HPI:  1. Mixed hyperlipidemia Does not watch diet and does no exercise.  Lab Results  Component Value Date   CHOL 154 01/29/2021   HDL 47 01/29/2021   LDLCALC 81 01/29/2021   TRIG 149 01/29/2021   CHOLHDL 3.3 01/29/2021     2. Recurrent major depressive disorder, in partial remission [facility]) Patient has had issues with depression for many years., she has had aot of family issues that have affected her depression. She says she is doing ok right now. Is currently on effexor 150XR. She depression score went up from 6 to 17 today. Depression screen [facility] 2/9 07/29/2021 01/29/2021 07/27/2020  Decreased Interest 3 1 0  Down, Depressed, Hopeless 1 1 0  PHQ - 2 Score 4 2 0  Altered sleeping 1 0 0  Tired, decreased energy 3 1 0  Change in appetite 3 1 0  Feeling bad or failure about yourself  3 1 0  Trouble concentrating 1 1 0  Moving slowly or fidgety/restless 1 0 0  Suicidal thoughts 1 0 0  PHQ-9 Score 17 6 0  Difficult doing work/chores Extremely dIfficult Not difficult at all Not difficult at all  Some recent data might be hidden     3. GAD (generalized anxiety disorder) She has lots of anxiety. Does not go out of her house much due to her anxiety. GAD 7 : Generalized Anxiety Score 07/29/2021 01/29/2021 07/27/2020 01/27/2020  Nervous, Anxious, on Edge '1 1 3 1  '$ Control/stop worrying '1 1 3 1  '$ Worry too much - different things '1 1 3 1  '$ Trouble relaxing 1 0 1 1  Restless 1 0 0 0  Easily annoyed or irritable '3 1 1 1  '$ Afraid - awful might happen '3 3 3 3  '$ Total GAD 7 Score '11 7 14 8  '$ Anxiety Difficulty Extremely difficult Not difficult at all Not difficult at all Not difficult at all      4. BMI 32.0-32.9,adult Weight is down 9lbs Wt Readings from Last 3 Encounters:  07/29/21 194 lb (88 kg)  01/29/21 203 lb (92.1 kg)  07/27/20  196 lb (88.9 kg)   BMI Readings from Last 3 Encounters:  07/29/21 31.31 kg/m  01/29/21 32.77 kg/m  07/27/20 31.64 kg/m      Outpatient Encounter Medications as of 07/29/2021  Medication Sig   ALPRAZolam (XANAX) 1 MG tablet Take 1 tablet (1 mg total) by mouth 2 (two) times daily.   atorvastatin (LIPITOR) 40 MG tablet Take 1 tablet (40 mg total) by mouth daily.   venlafaxine XR (EFFEXOR-XR) 150 MG 24 hr capsule Take 2 capsules (300 mg total) by mouth daily with breakfast.   No facility-administered encounter medications on file as of 07/29/2021.    Past Surgical History:  Procedure Laterality Date   hemrrhoid      Family History  Problem Relation Age of Onset   Cancer Father        stomach   Diabetes Father    Heart attack Father    Heart disease Father    Hyperlipidemia Brother     New complaints: None today  Social history: Lives with her husband and son  Controlled substance contract: 02/02/21     Review of Systems  Constitutional:  Negative for diaphoresis.  Eyes:  Negative for  pain.  Respiratory:  Negative for shortness of breath.   Cardiovascular:  Negative for chest pain, palpitations and leg swelling.  Gastrointestinal:  Negative for abdominal pain.  Endocrine: Negative for polydipsia.  Skin:  Negative for rash.  Neurological:  Negative for dizziness, weakness and headaches.  Hematological:  Does not bruise/bleed easily.  All other systems reviewed and are negative.     Objective:   Physical Exam Vitals and nursing note reviewed.  Constitutional:      General: She is not in acute distress.    Appearance: Normal appearance. She is well-developed.  HENT:     Head: Normocephalic.     Right Ear: Tympanic membrane normal.     Left Ear: Tympanic membrane normal.     Nose: Nose normal.     Mouth/Throat:     Mouth: Mucous membranes are moist.  Eyes:     Pupils: Pupils are equal, round, and reactive to light.  Neck:     Vascular: No carotid bruit  or JVD.  Cardiovascular:     Rate and Rhythm: Normal rate and regular rhythm.     Heart sounds: Normal heart sounds.  Pulmonary:     Effort: Pulmonary effort is normal. No respiratory distress.     Breath sounds: Normal breath sounds. No wheezing or rales.  Chest:     Chest wall: No tenderness.  Abdominal:     General: Bowel sounds are normal. There is no distension or abdominal bruit.     Palpations: Abdomen is soft. There is no hepatomegaly, splenomegaly, mass or pulsatile mass.     Tenderness: There is no abdominal tenderness.  Musculoskeletal:        General: Normal range of motion.     Cervical back: Normal range of motion and neck supple.  Lymphadenopathy:     Cervical: No cervical adenopathy.  Skin:    General: Skin is warm and dry.  Neurological:     Mental Status: She is alert and oriented to person, place, and time.     Deep Tendon Reflexes: Reflexes are normal and symmetric.  Psychiatric:        Behavior: Behavior normal.        Thought Content: Thought content normal.        Judgment: Judgment normal.    BP 111/77   Pulse 69   Temp (!) 97.3 F (36.3 C) (Temporal)   Resp 20   Ht '5\' 6"'$  (1.676 m)   Wt 194 lb (88 kg)   SpO2 97%   BMI 31.31 kg/m        Assessment & Plan:   TYA LISANTI comes in today with chief complaint of Medical Management of Chronic Issues   Diagnosis and orders addressed:  1. Mixed hyperlipidemia Low fat diet - atorvastatin (LIPITOR) 40 MG tablet; Take 1 tablet (40 mg total) by mouth daily.  Dispense: 90 tablet; Refill: 1  2. Recurrent major depressive disorder, in partial remission (HCC) Stress management - venlafaxine XR (EFFEXOR-XR) 150 MG 24 hr capsule; Take 2 capsules (300 mg total) by mouth daily with breakfast.  Dispense: 180 capsule; Refill: 1  3. GAD (generalized anxiety disorder) - ALPRAZolam (XANAX) 1 MG tablet; Take 1 tablet (1 mg total) by mouth 2 (two) times daily.  Dispense: 60 tablet; Refill: 5  4. BMI  31.0-31.9,adult Discussed diet and exercise for person with BMI >25 Will recheck weight in 3-6 months    Labs pending Health Maintenance reviewed Diet and exercise encouraged  Follow  up plan: 6 months   [name], FNP

## 2021-08-24 ENCOUNTER — Telehealth: Payer: Self-pay | Admitting: Nurse Practitioner

## 2021-08-24 ENCOUNTER — Other Ambulatory Visit: Payer: Self-pay | Admitting: Nurse Practitioner

## 2021-08-24 MED ORDER — VALACYCLOVIR HCL 1 G PO TABS: 2000.0000 mg | ORAL_TABLET | Freq: Three times a day (TID) | ORAL | 0 refills | Status: AC

## 2021-08-24 NOTE — Telephone Encounter (Signed)
Please review and advise.

## 2021-08-24 NOTE — Progress Notes (Signed)
Meds ordered this encounter  Medications   valACYclovir (VALTREX) 1000 MG tablet    Sig: Take 2 tablets (2,000 mg total) by mouth 3 (three) times daily for 2 days.    Dispense:  21 tablet    Refill:  0    Order Specific Question:   Supervising Provider    Answer:   Caryl Pina A [id number]

## 2021-09-14 ENCOUNTER — Telehealth: Payer: Self-pay | Admitting: Nurse Practitioner

## 2021-09-14 NOTE — Telephone Encounter (Signed)
Left message for patient to call back and schedule Medicare Annual Wellness Visit (AWV) by video or phone  Last AWV 08/21/2019  Please schedule at any time with [place].  45-minute appointment  Any questions, please contact me at [phone]

## 2021-10-05 ENCOUNTER — Other Ambulatory Visit: Payer: Self-pay | Admitting: Nurse Practitioner

## 2021-10-05 DIAGNOSIS — Z1231 Encounter for screening mammogram for malignant neoplasm of breast: Secondary | ICD-10-CM

## 2021-10-27 ENCOUNTER — Ambulatory Visit (INDEPENDENT_AMBULATORY_CARE_PROVIDER_SITE_OTHER): Payer: Medicare HMO

## 2021-10-27 VITALS — Ht 66.0 in | Wt 194.0 lb

## 2021-10-27 DIAGNOSIS — Z0001 Encounter for general adult medical examination with abnormal findings: Secondary | ICD-10-CM | POA: Diagnosis not present

## 2021-10-27 DIAGNOSIS — Z720 Tobacco use: Secondary | ICD-10-CM

## 2021-10-27 DIAGNOSIS — H539 Unspecified visual disturbance: Secondary | ICD-10-CM

## 2021-10-27 DIAGNOSIS — Z0000 Encounter for general adult medical examination without abnormal findings: Secondary | ICD-10-CM

## 2021-10-27 NOTE — Patient Instructions (Signed)
Brianna Sanchez , Thank you for taking time to come for your Medicare Wellness Visit. I appreciate your ongoing commitment to your health goals. Please review the following plan we discussed and let me know if I can assist you in the future.   Screening recommendations/referrals: Colonoscopy: Done 07/07/2015 Repeat in 10 years  Mammogram: Done 09/16/2020, scheduled for 11/2020. Bone Density: Done 01/27/2020 Repeat every 2 years  Recommended yearly ophthalmology/optometry visit for glaucoma screening and checkup Recommended yearly dental visit for hygiene and checkup  Vaccinations: Influenza vaccine: Due Repeat annually  Pneumococcal vaccine: Not due until age 6. Tdap vaccine: Due Repeat in 10 years  Shingles vaccine: Shingrix discussed. Please contact your pharmacy for coverage information.    Covid-19: Declined  Advanced directives: Advance directive discussed with you today. Even though you declined this today, please call our office should you change your mind, and we can give you the proper paperwork for you to fill out.   Conditions/risks identified: Aim for 30 minutes of exercise or brisk walking each day, drink 6-8 glasses of water and eat lots of fruits and vegetables.   Next appointment: Follow up in one year for your annual wellness visit. 2023.  Preventive Care 40-64 Years, Female Preventive care refers to lifestyle choices and visits with your health care provider that can promote health and wellness. What does preventive care include? A yearly physical exam. This is also called an annual well check. Dental exams once or twice a year. Routine eye exams. Ask your health care provider how often you should have your eyes checked. Personal lifestyle choices, including: Daily care of your teeth and gums. Regular physical activity. Eating a healthy diet. Avoiding tobacco and drug use. Limiting alcohol use. Practicing safe sex. Taking low-dose aspirin daily starting at age  24. Taking vitamin and mineral supplements as recommended by your health care provider. What happens during an annual well check? The services and screenings done by your health care provider during your annual well check will depend on your age, overall health, lifestyle risk factors, and family history of disease. Counseling  Your health care provider may ask you questions about your: Alcohol use. Tobacco use. Drug use. Emotional well-being. Home and relationship well-being. Sexual activity. Eating habits. Work and work Statistician. Method of birth control. Menstrual cycle. Pregnancy history. Screening  You may have the following tests or measurements: Height, weight, and BMI. Blood pressure. Lipid and cholesterol levels. These may be checked every 5 years, or more frequently if you are over 36 years old. Skin check. Lung cancer screening. You may have this screening every year starting at age 60 if you have a 30-pack-year history of smoking and currently smoke or have quit within the past 15 years. Fecal occult blood test (FOBT) of the stool. You may have this test every year starting at age 80. Flexible sigmoidoscopy or colonoscopy. You may have a sigmoidoscopy every 5 years or a colonoscopy every 10 years starting at age 64. Hepatitis C blood test. Hepatitis B blood test. Sexually transmitted disease (STD) testing. Diabetes screening. This is done by checking your blood sugar (glucose) after you have not eaten for a while (fasting). You may have this done every 1-3 years. Mammogram. This may be done every 1-2 years. Talk to your health care provider about when you should start having regular mammograms. This may depend on whether you have a family history of breast cancer. BRCA-related cancer screening. This may be done if you have a family history of  breast, ovarian, tubal, or peritoneal cancers. Pelvic exam and Pap test. This may be done every 3 years starting at age 70.  Starting at age 38, this may be done every 5 years if you have a Pap test in combination with an HPV test. Bone density scan. This is done to screen for osteoporosis. You may have this scan if you are at high risk for osteoporosis. Discuss your test results, treatment options, and if necessary, the need for more tests with your health care provider. Vaccines  Your health care provider may recommend certain vaccines, such as: Influenza vaccine. This is recommended every year. Tetanus, diphtheria, and acellular pertussis (Tdap, Td) vaccine. You may need a Td booster every 10 years. Zoster vaccine. You may need this after age 53. Pneumococcal 13-valent conjugate (PCV13) vaccine. You may need this if you have certain conditions and were not previously vaccinated. Pneumococcal polysaccharide (PPSV23) vaccine. You may need one or two doses if you smoke cigarettes or if you have certain conditions. Talk to your health care provider about which screenings and vaccines you need and how often you need them. This information is not intended to replace advice given to you by your health care provider. Make sure you discuss any questions you have with your health care provider. Document Released: 01/01/2016 Document Revised: 08/24/2016 Document Reviewed: 10/06/2015 Elsevier Interactive Patient Education  2017 [place] Prevention in the Home Falls can cause injuries. They can happen to people of all ages. There are many things you can do to make your home safe and to help prevent falls. What can I do on the outside of my home? Regularly fix the edges of walkways and driveways and fix any cracks. Remove anything that might make you trip as you walk through a door, such as a raised step or threshold. Trim any bushes or trees on the path to your home. Use bright outdoor lighting. Clear any walking paths of anything that might make someone trip, such as rocks or tools. Regularly check to see if  handrails are loose or broken. Make sure that both sides of any steps have handrails. Any raised decks and porches should have guardrails on the edges. Have any leaves, snow, or ice cleared regularly. Use sand or salt on walking paths during winter. Clean up any spills in your garage right away. This includes oil or grease spills. What can I do in the bathroom? Use night lights. Install grab bars by the toilet and in the tub and shower. Do not use towel bars as grab bars. Use non-skid mats or decals in the tub or shower. If you need to sit down in the shower, use a plastic, non-slip stool. Keep the floor dry. Clean up any water that spills on the floor as soon as it happens. Remove soap buildup in the tub or shower regularly. Attach bath mats securely with double-sided non-slip rug tape. Do not have throw rugs and other things on the floor that can make you trip. What can I do in the bedroom? Use night lights. Make sure that you have a light by your bed that is easy to reach. Do not use any sheets or blankets that are too big for your bed. They should not hang down onto the floor. Have a firm chair that has side arms. You can use this for support while you get dressed. Do not have throw rugs and other things on the floor that can make you trip. What  can I do in the kitchen? Clean up any spills right away. Avoid walking on wet floors. Keep items that you use a lot in easy-to-reach places. If you need to reach something above you, use a strong step stool that has a grab bar. Keep electrical cords out of the way. Do not use floor polish or wax that makes floors slippery. If you must use wax, use non-skid floor wax. Do not have throw rugs and other things on the floor that can make you trip. What can I do with my stairs? Do not leave any items on the stairs. Make sure that there are handrails on both sides of the stairs and use them. Fix handrails that are broken or loose. Make sure that  handrails are as long as the stairways. Check any carpeting to make sure that it is firmly attached to the stairs. Fix any carpet that is loose or worn. Avoid having throw rugs at the top or bottom of the stairs. If you do have throw rugs, attach them to the floor with carpet tape. Make sure that you have a light switch at the top of the stairs and the bottom of the stairs. If you do not have them, ask someone to add them for you. What else can I do to help prevent falls? Wear shoes that: Do not have high heels. Have rubber bottoms. Are comfortable and fit you well. Are closed at the toe. Do not wear sandals. If you use a stepladder: Make sure that it is fully opened. Do not climb a closed stepladder. Make sure that both sides of the stepladder are locked into place. Ask someone to hold it for you, if possible. Clearly mark and make sure that you can see: Any grab bars or handrails. First and last steps. Where the edge of each step is. Use tools that help you move around (mobility aids) if they are needed. These include: Canes. Walkers. Scooters. Crutches. Turn on the lights when you go into a dark area. Replace any light bulbs as soon as they burn out. Set up your furniture so you have a clear path. Avoid moving your furniture around. If any of your floors are uneven, fix them. If there are any pets around you, be aware of where they are. Review your medicines with your doctor. Some medicines can make you feel dizzy. This can increase your chance of falling. Ask your doctor what other things that you can do to help prevent falls. This information is not intended to replace advice given to you by your health care provider. Make sure you discuss any questions you have with your health care provider. Document Released: 10/01/2009 Document Revised: 05/12/2016 Document Reviewed: 01/09/2015 Elsevier Interactive Patient Education  2017 [employer].

## 2021-10-27 NOTE — Progress Notes (Signed)
Subjective:   Brianna Sanchez is a 55 y.o. female who presents for Medicare Annual (Subsequent) preventive examination. Virtual Visit via Telephone Note  I connected with  Brianna Sanchez on 10/27/21 at  8:15 AM EST by telephone and verified that I am speaking with the correct person using two identifiers.  Location: Patient: Home Provider: [facility] Persons participating in the virtual visit: patient/Nurse Health Advisor   I discussed the limitations, risks, security and privacy concerns of performing an evaluation and management service by telephone and the availability of in person appointments. The patient expressed understanding and agreed to proceed.  Interactive audio and video telecommunications were attempted between this nurse and patient, however failed, due to patient having technical difficulties OR patient did not have access to video capability.  We continued and completed visit with audio only.  Some vital signs may be absent or patient reported.   Brianna Driver, LPN  Review of Systems     Cardiac Risk Factors include: obesity (BMI >30kg/m2);sedentary lifestyle;dyslipidemia;smoking/ tobacco exposure     Objective:    Today's Vitals   10/27/21 0811  Weight: 194 lb (88 kg)  Height: 5\' 6"  (1.676 m)   Body mass index is 31.31 kg/m.  Advanced Directives 10/27/2021 08/21/2019 12/17/2015  Does Patient Have a Medical Advance Directive? No No No  Would patient like information on creating a medical advance directive? No - Patient declined No - Patient declined No - patient declined information    Current Medications (verified) Outpatient Encounter Medications as of 10/27/2021  Medication Sig   ALPRAZolam (XANAX) 1 MG tablet Take 1 tablet (1 mg total) by mouth 2 (two) times daily.   atorvastatin (LIPITOR) 40 MG tablet Take 1 tablet (40 mg total) by mouth daily.   BOOSTRIX 5-2.5-18.5 LF-MCG/0.5 injection    venlafaxine XR (EFFEXOR-XR) 150 MG 24 hr capsule Take 2  capsules (300 mg total) by mouth daily with breakfast.   No facility-administered encounter medications on file as of 10/27/2021.    Allergies (verified) Codeine and Medroxyprogesterone   History: Past Medical History:  Diagnosis Date   Depression    GAD (generalized anxiety disorder)    Hyperlipidemia    Past Surgical History:  Procedure Laterality Date   hemrrhoid     Family History  Problem Relation Age of Onset   Cancer Father        stomach   Diabetes Father    Heart attack Father    Heart disease Father    Hyperlipidemia Brother    Social History   Socioeconomic History   Marital status: Married    Spouse name: Brianna Sanchez   Number of children: 2   Years of education: Not on file   Highest education level: GED or equivalent  Occupational History   Occupation: disable  Tobacco Use   Smoking status: Every Day    Packs/day: 1.00    Years: 20.00    Pack years: 20.00    Types: E-cigarettes, Cigarettes   Smokeless tobacco: Never  Vaping Use   Vaping Use: Former  Substance and Sexual Activity   Alcohol use: No   Drug use: No   Sexual activity: Not on file  Other Topics Concern   Not on file  Social History Narrative   1 daughter and 1 son   2 grandchildren   Social Determinants of Health   Financial Resource Strain: Low Risk    Difficulty of Paying Living Expenses: Not hard at all  Food Insecurity:  No Food Insecurity   Worried About Charity fundraiser in the Last Year: Never true   Ran Out of Food in the Last Year: Never true  Transportation Needs: No Transportation Needs   Lack of Transportation (Medical): No   Lack of Transportation (Non-Medical): No  Physical Activity: Insufficiently Active   Days of Exercise per Week: 3 days   Minutes of Exercise per Session: 20 min  Stress: No Stress Concern Present   Feeling of Stress : Only a little  Social Connections: Moderately Isolated   Frequency of Communication with Friends and Family: More than three  times a week   Frequency of Social Gatherings with Friends and Family: More than three times a week   Attends Religious Services: Never   Marine scientist or Organizations: No   Attends Music therapist: Never   Marital Status: Married    Tobacco Counseling Ready to quit: Not Answered Counseling given: Not Answered   Clinical Intake:  Pre-visit preparation completed: No  Pain : No/denies pain     BMI - recorded: 31.31 Nutritional Status: BMI > 30  Obese Nutritional Risks: None Diabetes: No  How often do you need to have someone help you when you read instructions, pamphlets, or other written materials from your doctor or pharmacy?: 1 - Never  Diabetic?no  Interpreter Needed?: No  Information entered by :: Brianna Kyesha Balla, LPN   Activities of Daily Living In your present state of health, do you have any difficulty performing the following activities: 10/27/2021  Hearing? N  Vision? N  Difficulty concentrating or making decisions? Y  Walking or climbing stairs? N  Dressing or bathing? N  Doing errands, shopping? N  Preparing Food and eating ? N  Using the Toilet? N  In the past six months, have you accidently leaked urine? Y  Comment Urgency, stress incontinence at times.  Do you have problems with loss of bowel control? N  Managing your Medications? N  Managing your Finances? N  Housekeeping or managing your Housekeeping? N  Some recent data might be hidden    Patient Care Team: Brianna Pretty, FNP as PCP - General (Nurse Practitioner)  Indicate any recent Medical Services you may have received from other than [facility] providers in the past year (date may be approximate).     Assessment:   This is a routine wellness examination for Brianna Sanchez.  Hearing/Vision screen Hearing Screening - Comments:: Some hearing loss.  Vision Screening - Comments:: Readers. Would like referral.  Dietary issues and exercise activities discussed: Current  Exercise Habits: Home exercise routine, Type of exercise: walking, Time (Minutes): 20, Frequency (Times/Week): 3, Weekly Exercise (Minutes/Week): 60, Intensity: Mild, Exercise limited by: cardiac condition(s)   Goals Addressed             This Visit's Progress    DIET - REDUCE CALORIE INTAKE       Pt would like to lose weight.       Depression Screen PHQ 2/9 Scores 10/27/2021 07/29/2021 01/29/2021 07/27/2020 01/27/2020 12/06/2019 08/21/2019  PHQ - 2 Score 0 4 2 0 0 3 0  PHQ- 9 Score - 17 6 0 - 5 -    Fall Risk Fall Risk  10/27/2021 07/29/2021 07/27/2020 01/27/2020 12/06/2019  Falls in the past year? 0 0 0 0 0  Number falls in past yr: 0 - - - -  Injury with Fall? 0 - - - -  Risk for fall due to : Impaired vision - - - -  Follow up Falls prevention discussed - - - -    FALL RISK PREVENTION PERTAINING TO THE HOME:  Any stairs in or around the home? Yes  If so, are there any without handrails? No  Home free of loose throw rugs in walkways, pet beds, electrical cords, etc? Yes  Adequate lighting in your home to reduce risk of falls? Yes   ASSISTIVE DEVICES UTILIZED TO PREVENT FALLS:  Life alert? No  Use of a cane, walker or w/c? No  Grab bars in the bathroom? No  Shower chair or bench in shower? No  Elevated toilet seat or a handicapped toilet? Yes   TIMED UP AND GO:  Was the test performed? No .    Cognitive Function: MMSE - Mini Mental State Exam 12/17/2015  Orientation to time 5  Orientation to Place 5  Registration 3  Attention/ Calculation 2  Recall 2  Language- name 2 objects 2  Language- repeat 1  Language- follow 3 step command 3  Language- read & follow direction 1  Write a sentence 1  Copy design 1  Total score 26     6CIT Screen 10/27/2021 08/21/2019  What Year? 0 points 0 points  What month? 0 points 0 points  What time? 0 points 0 points  Count back from 20 0 points 0 points  Months in reverse 0 points 0 points  Repeat phrase 4 points 6 points  Total  Score 4 6    Immunizations Immunization History  Administered Date(s) Administered   Influenza,inj,Quad PF,6+ Mos 12/17/2015, 12/21/2016, 11/10/2017, 10/26/2018, 01/29/2021   Influenza-Unspecified 10/24/2017, 10/24/2018   Td 04/16/1990    TDAP status: Due, Education has been provided regarding the importance of this vaccine. Advised may receive this vaccine at local pharmacy or Health Dept. Aware to provide a copy of the vaccination record if obtained from local pharmacy or Health Dept. Verbalized acceptance and understanding.  Flu Vaccine status: Due, Education has been provided regarding the importance of this vaccine. Advised may receive this vaccine at local pharmacy or Health Dept. Aware to provide a copy of the vaccination record if obtained from local pharmacy or Health Dept. Verbalized acceptance and understanding.  Pneumococcal vaccine status: Due, Education has been provided regarding the importance of this vaccine. Advised may receive this vaccine at local pharmacy or Health Dept. Aware to provide a copy of the vaccination record if obtained from local pharmacy or Health Dept. Verbalized acceptance and understanding.  Covid-19 vaccine status: Declined, Education has been provided regarding the importance of this vaccine but patient still declined. Advised may receive this vaccine at local pharmacy or Health Dept.or vaccine clinic. Aware to provide a copy of the vaccination record if obtained from local pharmacy or Health Dept. Verbalized acceptance and understanding.  Qualifies for Shingles Vaccine? Yes   Zostavax completed No   Shingrix Completed?: No.    Education has been provided regarding the importance of this vaccine. Patient has been advised to call insurance company to determine out of pocket expense if they have not yet received this vaccine. Advised may also receive vaccine at local pharmacy or Health Dept. Verbalized acceptance and understanding.  Screening Tests Health  Maintenance  Topic Date Due   COVID-19 Vaccine (1) Never done   TETANUS/TDAP  04/16/2000   INFLUENZA VACCINE  07/19/2021   MAMMOGRAM  09/16/2021   Zoster Vaccines- Shingrix (1 of 2) 10/29/2021 (Originally 02/21/2015)   Pneumococcal Vaccine 33-3 Years old (1 - PCV) 07/29/2022 (Originally [birth year]/21/1971)   DEXA  SCAN  01/26/2022   PAP SMEAR-Modifier  01/30/2024   COLONOSCOPY (Pts 45-20yrs Insurance coverage will need to be confirmed)  07/06/2025   Hepatitis C Screening  Completed   HIV Screening  Completed   HPV VACCINES  Aged Out    Health Maintenance  Health Maintenance Due  Topic Date Due   COVID-19 Vaccine (1) Never done   TETANUS/TDAP  04/16/2000   INFLUENZA VACCINE  07/19/2021   MAMMOGRAM  09/16/2021    Colorectal cancer screening: Type of screening: Colonoscopy. Completed 07/07/2015. Repeat every 10 years  Mammogram status: Completed 09/16/2020. Repeat every year  Bone Density status: Completed 01/27/2020. Results reflect: Bone density results: NORMAL. Repeat every 2 years.  Lung Cancer Screening: (Low Dose CT Chest recommended if Age 33-80 years, 30 pack-year currently smoking OR have quit w/in 15years.) does not qualify.    Additional Screening:  Hepatitis C Screening: does not qualify; Completed 03/03/2016  Vision Screening: Recommended annual ophthalmology exams for early detection of glaucoma and other disorders of the eye. Is the patient up to date with their annual eye exam?  No  Who is the provider or what is the name of the office in which the patient attends annual eye exams? N/A If pt is not established with a provider, would they like to be referred to a provider to establish care? Yes .   Dental Screening: Recommended annual dental exams for proper oral hygiene  Community Resource Referral / Chronic Care Management: CRR required this visit?  No   CCM required this visit?  No      Plan:     I have personally reviewed and noted the following in the patient's  chart:   Medical and social history Use of alcohol, tobacco or illicit drugs  Current medications and supplements including opioid prescriptions.  Functional ability and status Nutritional status Physical activity Advanced directives List of other physicians Hospitalizations, surgeries, and ER visits in previous 12 months Vitals Screenings to include cognitive, depression, and falls Referrals and appointments  In addition, I have reviewed and discussed with patient certain preventive protocols, quality metrics, and best practice recommendations. A written personalized care plan for preventive services as well as general preventive health recommendations were provided to patient.     Brianna Driver, LPN   [id number]   Nurse Notes: Mammogram scheduled for 11/2021. Up to date on other health maintenance. Pt would like referral to eye MD. Pt states she will get flu vaccine this week. Discussed shingles and covid vaccines.

## 2021-11-19 ENCOUNTER — Other Ambulatory Visit: Payer: Self-pay

## 2021-11-19 ENCOUNTER — Ambulatory Visit
Admission: RE | Admit: 2021-11-19 | Discharge: 2021-11-19 | Disposition: A | Discharge status: Home / Self Care | Payer: Medicare HMO | Source: Ambulatory Visit | Attending: Nurse Practitioner | Admitting: Nurse Practitioner

## 2021-11-19 DIAGNOSIS — Z1231 Encounter for screening mammogram for malignant neoplasm of breast: Secondary | ICD-10-CM | POA: Diagnosis not present

## 2022-01-31 ENCOUNTER — Encounter: Payer: Self-pay | Admitting: Nurse Practitioner

## 2022-01-31 ENCOUNTER — Ambulatory Visit (INDEPENDENT_AMBULATORY_CARE_PROVIDER_SITE_OTHER): Payer: Medicare HMO | Admitting: Nurse Practitioner

## 2022-01-31 VITALS — BP 121/84 | HR 74 | Temp 96.8°F | Resp 20 | Ht 66.0 in | Wt 199.0 lb

## 2022-01-31 DIAGNOSIS — F3341 Major depressive disorder, recurrent, in partial remission: Secondary | ICD-10-CM

## 2022-01-31 DIAGNOSIS — E782 Mixed hyperlipidemia: Secondary | ICD-10-CM | POA: Diagnosis not present

## 2022-01-31 DIAGNOSIS — Z6832 Body mass index (BMI) 32.0-32.9, adult: Secondary | ICD-10-CM | POA: Diagnosis not present

## 2022-01-31 DIAGNOSIS — F411 Generalized anxiety disorder: Secondary | ICD-10-CM | POA: Diagnosis not present

## 2022-01-31 LAB — CBC WITH DIFFERENTIAL/PLATELET
Basophils Absolute: 0 10*3/uL (ref 0.0–0.2)
Basos: 1 %
EOS (ABSOLUTE): 0.1 10*3/uL (ref 0.0–0.4)
Eos: 1 %
Hematocrit: 42.1 % (ref 34.0–46.6)
Hemoglobin: 13.9 g/dL (ref 11.1–15.9)
Immature Grans (Abs): 0 10*3/uL (ref 0.0–0.1)
Immature Granulocytes: 0 %
Lymphocytes Absolute: 2.1 10*3/uL (ref 0.7–3.1)
Lymphs: 35 %
MCH: 29.6 pg (ref 26.6–33.0)
MCHC: 33 g/dL (ref 31.5–35.7)
MCV: 90 fL (ref 79–97)
Monocytes Absolute: 0.5 10*3/uL (ref 0.1–0.9)
Monocytes: 8 %
Neutrophils Absolute: 3.3 10*3/uL (ref 1.4–7.0)
Neutrophils: 55 %
Platelets: 387 10*3/uL (ref 150–450)
RBC: 4.7 x10E6/uL (ref 3.77–5.28)
RDW: 13.1 % (ref 11.7–15.4)
WBC: 6 10*3/uL (ref 3.4–10.8)

## 2022-01-31 LAB — LIPID PANEL
Chol/HDL Ratio: 3.1 ratio (ref 0.0–4.4)
Cholesterol, Total: 141 mg/dL (ref 100–199)
HDL: 46 mg/dL (ref >39)
LDL Chol Calc (NIH): 74 mg/dL (ref 0–99)
Triglycerides: 119 mg/dL (ref 0–149)
VLDL Cholesterol Cal: 21 mg/dL (ref 5–40)

## 2022-01-31 LAB — CMP14+EGFR
ALT: 26 IU/L (ref 0–32)
AST: 18 IU/L (ref 0–40)
Albumin/Globulin Ratio: 1.7 (ref 1.2–2.2)
Albumin: 4.3 g/dL (ref 3.8–4.9)
Alkaline Phosphatase: 107 IU/L (ref 44–121)
BUN/Creatinine Ratio: 22 (ref 9–23)
BUN: 16 mg/dL (ref 6–24)
Bilirubin Total: 0.3 mg/dL (ref 0.0–1.2)
CO2: 23 mmol/L (ref 20–29)
Calcium: 9.6 mg/dL (ref 8.7–10.2)
Chloride: 103 mmol/L (ref 96–106)
Creatinine, Ser: 0.72 mg/dL (ref 0.57–1.00)
Globulin, Total: 2.5 g/dL (ref 1.5–4.5)
Glucose: 110 mg/dL — ABNORMAL HIGH (ref 70–99)
Potassium: 4.7 mmol/L (ref 3.5–5.2)
Sodium: 139 mmol/L (ref 134–144)
Total Protein: 6.8 g/dL (ref 6.0–8.5)
eGFR: 98 mL/min/{1.73_m2} (ref >59)

## 2022-01-31 MED ORDER — VENLAFAXINE HCL ER 150 MG PO CP24: 300.0000 mg | ORAL_CAPSULE | Freq: Every day | ORAL | 1 refills | Status: DC

## 2022-01-31 MED ORDER — ALPRAZOLAM 1 MG PO TABS: 1.0000 mg | ORAL_TABLET | Freq: Two times a day (BID) | ORAL | 5 refills | Status: DC

## 2022-01-31 MED ORDER — ATORVASTATIN CALCIUM 40 MG PO TABS: 40.0000 mg | ORAL_TABLET | Freq: Every day | ORAL | 1 refills | Status: DC

## 2022-01-31 NOTE — Patient Instructions (Signed)

## 2022-01-31 NOTE — Addendum Note (Signed)
Addended by: Chevis Pretty on: 01/31/2022 08:47 AM   Modules accepted: Orders

## 2022-01-31 NOTE — Progress Notes (Addendum)
Subjective:    Patient ID: Brianna Sanchez, female    DOB: 03-31-1966, 56 y.o.   MRN: [id number]   Chief Complaint: medical management of chronic issues     HPI:  Brianna Sanchez is a 56 y.o. who identifies as a female who was assigned female at birth.   Social history: Lives with: husband and son Work history: disability   Comes in today for follow up of the following chronic medical issues:  1. Mixed hyperlipidemia Does not really watch diet and does little to no exercise. Lab Results  Component Value Date   CHOL 153 07/29/2021   HDL 47 07/29/2021   LDLCALC 83 07/29/2021   TRIG 129 07/29/2021   CHOLHDL 3.3 07/29/2021     2. Recurrent major depressive disorder, in partial remission ([place]) Has been on effexor for many years. Brianna Sanchez is doing well. Depression screen [facility] 2/9 01/31/2022 10/27/2021 07/29/2021  Decreased Interest 2 0 3  Down, Depressed, Hopeless 1 0 1  PHQ - 2 Score 3 0 4  Altered sleeping 3 - 1  Tired, decreased energy 2 - 3  Change in appetite 2 - 3  Feeling bad or failure about yourself  1 - 3  Trouble concentrating 1 - 1  Moving slowly or fidgety/restless 2 - 1  Suicidal thoughts 1 - 1  PHQ-9 Score 15 - 17  Difficult doing work/chores Somewhat difficult - Extremely dIfficult  Some recent data might be hidden      3. GAD (generalized anxiety disorder) Brianna Sanchez is a very anxious person., does not go out of her house very often. GAD 7 : Generalized Anxiety Score 01/31/2022 07/29/2021 01/29/2021 07/27/2020  Nervous, Anxious, on Edge _0 Control/stop worrying _1 Worry too much - different things _2 Trouble relaxing 1 1 0 1  Restless 1 1 0 0  Easily annoyed or irritable _3 Afraid - awful might happen _4 Total GAD 7 Score _5 Anxiety Difficulty Extremely difficult Extremely difficult Not difficult at all Not difficult at all      4. BMI 31.0-31.9,adult Weight is up 5 lbs. Wt Readings from Last 3 Encounters:  01/31/22  199 lb (90.3 kg)  10/27/21 194 lb (88 kg)  07/29/21 194 lb (88 kg)   BMI Readings from Last 3 Encounters:  01/31/22 32.12 kg/m  10/27/21 31.31 kg/m  07/29/21 31.31 kg/m     New complaints: None today  Allergies  Allergen Reactions   Codeine    Medroxyprogesterone    Outpatient Encounter Medications as of 01/31/2022  Medication Sig   ALPRAZolam (XANAX) 1 MG tablet Take 1 tablet (1 mg total) by mouth 2 (two) times daily.   atorvastatin (LIPITOR) 40 MG tablet Take 1 tablet (40 mg total) by mouth daily.   BOOSTRIX 5-2.5-18.5 LF-MCG/0.5 injection    venlafaxine XR (EFFEXOR-XR) 150 MG 24 hr capsule Take 2 capsules (300 mg total) by mouth daily with breakfast.   No facility-administered encounter medications on file as of 01/31/2022.    Past Surgical History:  Procedure Laterality Date   hemrrhoid      Family History  Problem Relation Age of Onset   Cancer Father        stomach   Diabetes Father    Heart attack Father    Heart disease Father    Hyperlipidemia Brother    Breast cancer Neg Hx  Controlled substance contract: 01/31/22     Review of Systems  Constitutional:  Negative for diaphoresis.  Eyes:  Negative for pain.  Respiratory:  Negative for shortness of breath.   Cardiovascular:  Negative for chest pain, palpitations and leg swelling.  Gastrointestinal:  Negative for abdominal pain.  Endocrine: Negative for polydipsia.  Skin:  Negative for rash.  Neurological:  Negative for dizziness, weakness and headaches.  Hematological:  Does not bruise/bleed easily.  All other systems reviewed and are negative.     Objective:   Physical Exam Vitals and nursing note reviewed.  Constitutional:      General: Brianna Sanchez is not in acute distress.    Appearance: Normal appearance. Brianna Sanchez is well-developed.  HENT:     Head: Normocephalic.     Right Ear: Tympanic membrane normal.     Left Ear: Tympanic membrane normal.     Nose: Nose normal.     Mouth/Throat:      Mouth: Mucous membranes are moist.  Eyes:     Pupils: Pupils are equal, round, and reactive to light.  Neck:     Vascular: No carotid bruit or JVD.  Cardiovascular:     Rate and Rhythm: Normal rate and regular rhythm.     Heart sounds: Normal heart sounds.  Pulmonary:     Effort: Pulmonary effort is normal. No respiratory distress.     Breath sounds: Normal breath sounds. No wheezing or rales.  Chest:     Chest wall: No tenderness.  Abdominal:     General: Bowel sounds are normal. There is no distension or abdominal bruit.     Palpations: Abdomen is soft. There is no hepatomegaly, splenomegaly, mass or pulsatile mass.     Tenderness: There is no abdominal tenderness.  Musculoskeletal:        General: Normal range of motion.     Cervical back: Normal range of motion and neck supple.  Lymphadenopathy:     Cervical: No cervical adenopathy.  Skin:    General: Skin is warm and dry.  Neurological:     Mental Status: Brianna Sanchez is alert and oriented to person, place, and time.     Deep Tendon Reflexes: Reflexes are normal and symmetric.  Psychiatric:        Behavior: Behavior normal.        Thought Content: Thought content normal.        Judgment: Judgment normal.    BP 121/84    Pulse 74    Temp (!) 96.8 F (36 C) (Temporal)    Resp 20    Ht _0  (1.676 m)    Wt 199 lb (90.3 kg)    SpO2 95%    BMI 32.12 kg/m        Assessment & Plan:   Brianna Sanchez in today with chief complaint of Medical Management of Chronic Issues   1. Mixed hyperlipidemia Low fat diet - atorvastatin (LIPITOR) 40 MG tablet; Take 1 tablet (40 mg total) by mouth daily.  Dispense: 90 tablet; Refill: 1  2. Recurrent major depressive disorder, in partial remission ([facility]) Stress management - venlafaxine XR (EFFEXOR-XR) 150 MG 24 hr capsule; Take 2 capsules (300 mg total) by mouth daily with breakfast.  Dispense: 180 capsule; Refill: 1  3. GAD (generalized anxiety disorder) Stress management - ALPRAZolam  (XANAX) 1 MG tablet; Take 1 tablet (1 mg total) by mouth 2 (two) times daily.  Dispense: 60 tablet; Refill: 5  4. BMI 32.0-32.9,adult Discussed diet and exercise  for person with BMI >25 Will recheck weight in 3-6 months  Orders Placed This Encounter  Procedures   CBC with Differential/Platelet   CMP14+EGFR   Lipid panel      The above assessment and management plan was discussed with the patient. The patient verbalized understanding of and has agreed to the management plan. Patient is aware to call the clinic if symptoms persist or worsen. Patient is aware when to return to the clinic for a follow-up visit. Patient educated on when it is appropriate to go to the emergency department.   [name], FNP

## 2022-03-17 ENCOUNTER — Ambulatory Visit (INDEPENDENT_AMBULATORY_CARE_PROVIDER_SITE_OTHER): Payer: Medicare HMO | Admitting: Nurse Practitioner

## 2022-03-17 ENCOUNTER — Encounter: Payer: Self-pay | Admitting: Nurse Practitioner

## 2022-03-17 VITALS — BP 125/86 | HR 76 | Temp 98.1°F | Resp 20 | Ht 66.0 in | Wt 197.0 lb

## 2022-03-17 DIAGNOSIS — D485 Neoplasm of uncertain behavior of skin: Secondary | ICD-10-CM

## 2022-03-17 DIAGNOSIS — H6191 Disorder of right external ear, unspecified: Secondary | ICD-10-CM

## 2022-03-17 NOTE — Progress Notes (Addendum)
? ?  Subjective:  ? ? Patient ID: Brianna Sanchez, female    DOB: 01/10/1966, 56 y.o.   MRN: [id number] ? ?Chief Complaint: skin lesion on ear ? ?HPI ?Patient has skin lesion on right auricle. Has gotten some bigger. Is nontender. ? ? ? ?Review of Systems  ?Constitutional:  Negative for diaphoresis.  ?Eyes:  Negative for pain.  ?Respiratory:  Negative for shortness of breath.   ?Cardiovascular:  Negative for chest pain, palpitations and leg swelling.  ?Gastrointestinal:  Negative for abdominal pain.  ?Endocrine: Negative for polydipsia.  ?Skin:  Negative for rash.  ?Neurological:  Negative for dizziness, weakness and headaches.  ?Hematological:  Does not bruise/bleed easily.  ?All other systems reviewed and are negative. ? ?   ?Objective:  ? Physical Exam ?Vitals and nursing note reviewed.  ?Constitutional:   ?   General: She is not in acute distress. ?   Appearance: Normal appearance. She is well-developed.  ?HENT:  ?   Right Ear: Tympanic membrane normal.  ?   Left Ear: Tympanic membrane normal.  ?Neck:  ?   Vascular: No carotid bruit or JVD.  ?Cardiovascular:  ?   Rate and Rhythm: Normal rate and regular rhythm.  ?   Heart sounds: Normal heart sounds.  ?Pulmonary:  ?   Effort: Pulmonary effort is normal. No respiratory distress.  ?   Breath sounds: Normal breath sounds. No wheezing or rales.  ?Chest:  ?   Chest wall: No tenderness.  ?Abdominal:  ?   General: Bowel sounds are normal. There is no distension or abdominal bruit.  ?   Palpations: Abdomen is soft. There is no hepatomegaly, splenomegaly, mass or pulsatile mass.  ?   Tenderness: There is no abdominal tenderness.  ?Musculoskeletal:     ?   General: Normal range of motion.  ?   Cervical back: Normal range of motion and neck supple.  ?Lymphadenopathy:  ?   Cervical: No cervical adenopathy.  ?Skin: ?   General: Skin is warm and dry.  ?   Comments: 60m raised scaley lesion on right auricle  ?Neurological:  ?   Mental Status: She is alert and oriented to person,  place, and time.  ?   Deep Tendon Reflexes: Reflexes are normal and symmetric.  ?Psychiatric:     ?   Behavior: Behavior normal.     ?   Thought Content: Thought content normal.     ?   Judgment: Judgment normal.  ? ? ?BP 125/86   Pulse 76   Temp 98.1 ?F (36.7 ?C) (Temporal)   Resp 20   Ht '5\' 6"'$  (1.676 m)   Wt 197 lb (89.4 kg)   SpO2 98%   BMI 31.80 kg/m?  ? ?Cryotherapy to right auricle lesion ? ?   ?Assessment & Plan:  ?LDEATRICE SPANBAUERin today with chief complaint of skin lesion on ear ? ? ?1. Lesion of right external ear ?Do not pick or scratch at area ?Will trun black for a few days' ?]watch for any infection ?RTO prn ? ? ? ?The above assessment and management plan was discussed with the patient. The patient verbalized understanding of and has agreed to the management plan. Patient is aware to call the clinic if symptoms persist or worsen. Patient is aware when to return to the clinic for a follow-up visit. Patient educated on when it is appropriate to go to the emergency department.  ? ?Mary-Margaret MHassell Done FNP ? ? ? ?

## 2022-03-17 NOTE — Patient Instructions (Signed)
Cryoablation Cryoablation is a procedure used to remove abnormal growths or cancerous tissue. This is done by freezing the growth or tissue with either liquid nitrogen or argon gas. This procedure is also known as cryotherapy or cryosurgery. This procedure may be done to treat many conditions, including: Skin tumors. Non-cancerous (benign) knots of tissue called nodules. A type of eye cancer (retinoblastoma). Cancers of the prostate, liver, kidney, cervix, lung, and bone. Tell a health care provider about: Any allergies you have. All medicines you are taking, including vitamins, herbs, eye drops, creams, and over-the-counter medicines. Any problems you or family members have had with anesthetic medicines. Any blood disorders you have. Any surgeries you have had. Any medical conditions you have. Whether you are pregnant or may be pregnant. What are the risks? Generally, this is a safe procedure. However, problems may occur, including: Infection. Bleeding. Swelling. Allergic reactions to medicines. Damage to nearby structures or organs, such as damage to nerves, which may cause numbness. This is rare. What happens before the procedure? Staying hydrated Follow instructions from your health care provider about hydration, which may include: Up to 2 hours before the procedure - you may continue to drink clear liquids, such as water, clear fruit juice, black coffee, and plain tea.  Eating and drinking restrictions Follow instructions from your health care provider about eating and drinking, which may include: 8 hours before the procedure - stop eating heavy meals or foods, such as meat, fried foods, or fatty foods. 6 hours before the procedure - stop eating light meals or foods, such as toast or cereal. 6 hours before the procedure - stop drinking milk or drinks that contain milk. 2 hours before the procedure - stop drinking clear liquids. Medicines Ask your health care provider  about: Changing or stopping your regular medicines. This is especially important if you are taking diabetes medicines or blood thinners. Taking medicines such as aspirin and ibuprofen. These medicines can thin your blood. Do not take these medicines unless your health care provider tells you to take them. Taking over-the-counter medicines, vitamins, herbs, and supplements. Tests You may have tests done, including blood tests and imaging tests. General instructions A medical history will be taken, and a physical exam will be done. Do not use any products that contain nicotine or tobacco for at least 4 weeks before the procedure. These products include cigarettes, e-cigarettes, and chewing tobacco. If you need help quitting, ask your health care provider. Ask your health care provider: How your surgery site will be marked. What steps will be taken to help prevent infection. These may include: Removing hair at the surgery site. Washing skin with a germ-killing soap. Receiving antibiotic medicine. Plan to have someone take you home from the hospital or clinic. If you will be going home right after the procedure, plan to have someone with you for 24 hours. What happens during the procedure? An IV will be inserted into one of your veins. You will be given one or more of the following: A medicine to help you relax (sedative). A medicine to numb the area (local anesthetic). A medicine to make you fall asleep (general anesthetic). A medicine that is injected into your spine to numb the area below and slightly above the injection site (spinal anesthetic). A medicine that is injected into an area of your body to numb everything below the injection site (regional anesthetic). A device called a cryoprobe will be used to treat the growth by freezing it. The cryoprobe has liquid   nitrogen or argon gas flowing through it. Depending on how deep in the body the growth is found, the cryoprobe can be: Applied  directly to the area, such as for skin cancers or nodules. Inserted through an incision into the area, such as the prostate. Passed through a thin, long tube (endoscope) to reach deeper areas of the body, such as the lungs or liver. The cryoprobe may be guided using imaging, such as an ultrasound, a CT scan, or an MRI. Liquid nitrogen or argon gas will be delivered through the cryoprobe to the growth until it is frozen and destroyed. The process may be repeated on other areas depending on how many areas need treatment. The cryoprobe will be removed, and pressure will be applied to stop any bleeding. If an incision was made, it will be closed with stitches (sutures) and covered with a bandage (dressing). The procedure will vary depending on the location of the growth. The proceduremay also vary among health care providers and hospitals. What happens after the procedure?  Your blood pressure, heart rate, breathing rate, and blood oxygen level will be monitored until you leave the hospital or clinic. You will be given medicine to help with pain, nausea, and vomiting as needed. If you were given a sedative during the procedure, it can affect you for several hours. Do not drive or operate machinery until your health care provider says that it is safe. Summary Cryoablation is a procedure used to remove abnormal growths or cancerous tissue. This is done by freezing the growth or tissue with either liquid nitrogen or argon gas. If you will be going home right after the procedure, plan to have someone with you for 24 hours. Your health care provider may use a device called a cryoprobe that has liquid nitrogen or argon gas flowing through it. Liquid nitrogen or argon gas will be delivered through the cryoprobe to the growth until it is frozen and destroyed. The process may be repeated on other areas depending on how many areas need treatment. This information is not intended to replace advice given to you  by your health care provider. Make sure you discuss any questions you have with your healthcare provider. Document Revised: 09/12/2019 Document Reviewed: 09/12/2019 Elsevier Patient Education  2022 [employer]  

## 2022-08-01 ENCOUNTER — Encounter: Payer: Self-pay | Admitting: Nurse Practitioner

## 2022-08-01 ENCOUNTER — Ambulatory Visit (INDEPENDENT_AMBULATORY_CARE_PROVIDER_SITE_OTHER): Payer: Medicare HMO | Admitting: Nurse Practitioner

## 2022-08-01 VITALS — BP 115/81 | HR 86 | Temp 97.0°F | Resp 20 | Ht 66.0 in | Wt 189.0 lb

## 2022-08-01 DIAGNOSIS — F3341 Major depressive disorder, recurrent, in partial remission: Secondary | ICD-10-CM

## 2022-08-01 DIAGNOSIS — Z6832 Body mass index (BMI) 32.0-32.9, adult: Secondary | ICD-10-CM

## 2022-08-01 DIAGNOSIS — E782 Mixed hyperlipidemia: Secondary | ICD-10-CM | POA: Diagnosis not present

## 2022-08-01 DIAGNOSIS — F411 Generalized anxiety disorder: Secondary | ICD-10-CM

## 2022-08-01 MED ORDER — ALPRAZOLAM 1 MG PO TABS: 1.0000 mg | ORAL_TABLET | Freq: Two times a day (BID) | ORAL | 5 refills | Status: DC

## 2022-08-01 MED ORDER — ATORVASTATIN CALCIUM 40 MG PO TABS: 40.0000 mg | ORAL_TABLET | Freq: Every day | ORAL | 1 refills | Status: DC

## 2022-08-01 MED ORDER — VENLAFAXINE HCL ER 150 MG PO CP24: 300.0000 mg | ORAL_CAPSULE | Freq: Every day | ORAL | 1 refills | Status: DC

## 2022-08-01 NOTE — Progress Notes (Signed)
Subjective:    Patient ID: Brianna Sanchez, female    DOB: 07/06/1966, 56 y.o.   MRN: [id number]   Chief Complaint: medical management of chronic issues     HPI:  Brianna Sanchez is a 56 y.o. who identifies as a female who was assigned female at birth.   Social history: Lives with: husband  Work history: disability   Comes in today for follow up of the following chronic medical issues:  1. Mixed hyperlipidemia Does try to watch diet but does no dedicated exercise. Lab Results  Component Value Date   CHOL 141 01/31/2022   HDL 46 01/31/2022   LDLCALC 74 01/31/2022   TRIG 119 01/31/2022   CHOLHDL 3.1 01/31/2022   The 10-year ASCVD risk score (Arnett DK, et al., 2019) is: 4.4%   2. Recurrent major depressive disorder, in partial remission ([place]) Is on effexor and is doing well.    08/01/2022    8:13 AM 01/31/2022    8:19 AM 10/27/2021    8:18 AM  Depression screen PHQ 2/9  Decreased Interest 2 2 0  Down, Depressed, Hopeless 2 1 0  PHQ - 2 Score 4 3 0  Altered sleeping 3 3   Tired, decreased energy 1 2   Change in appetite 2 2   Feeling bad or failure about yourself  2 1   Trouble concentrating 2 1   Moving slowly or fidgety/restless 2 2   Suicidal thoughts 1 1   PHQ-9 Score 17 15   Difficult doing work/chores Somewhat difficult Somewhat difficult     3. GAD (generalized anxiety disorder) Takes xanax daily. Her daughter has been released from jail which has caused some stress in her family.    08/01/2022    8:13 AM 01/31/2022    8:19 AM 07/29/2021    8:20 AM 01/29/2021    8:28 AM  GAD 7 : Generalized Anxiety Score  Nervous, Anxious, on Edge _0 Control/stop worrying _1 Worry too much - different things _2 Trouble relaxing _3 0  Restless _4 0  Easily annoyed or irritable _5 Afraid - awful might happen _6 Total GAD 7 Score _7 Anxiety Difficulty Very difficult Extremely difficult Extremely difficult Not difficult at  all      4. BMI 32.0-32.9,adult Weight down 8 lbs  Wt Readings from Last 3 Encounters:  08/01/22 189 lb (85.7 kg)  03/17/22 197 lb (89.4 kg)  01/31/22 199 lb (90.3 kg)   BMI Readings from Last 3 Encounters:  08/01/22 30.51 kg/m  03/17/22 31.80 kg/m  01/31/22 32.12 kg/m     New complaints: None today  Allergies  Allergen Reactions   Codeine    Medroxyprogesterone    Outpatient Encounter Medications as of 08/01/2022  Medication Sig   ALPRAZolam (XANAX) 1 MG tablet Take 1 tablet (1 mg total) by mouth 2 (two) times daily.   atorvastatin (LIPITOR) 40 MG tablet Take 1 tablet (40 mg total) by mouth daily.   venlafaxine XR (EFFEXOR-XR) 150 MG 24 hr capsule Take 2 capsules (300 mg total) by mouth daily with breakfast.   No facility-administered encounter medications on file as of 08/01/2022.    Past Surgical History:  Procedure Laterality Date   hemrrhoid      Family History  Problem Relation Age of Onset   Cancer Father  stomach   Diabetes Father    Heart attack Father    Heart disease Father    Hyperlipidemia Brother    Breast cancer Neg Hx       Controlled substance contract: n/a     Review of Systems  Constitutional:  Negative for diaphoresis.  Eyes:  Negative for pain.  Respiratory:  Negative for shortness of breath.   Cardiovascular:  Negative for chest pain, palpitations and leg swelling.  Gastrointestinal:  Negative for abdominal pain.  Endocrine: Negative for polydipsia.  Skin:  Negative for rash.  Neurological:  Negative for dizziness, weakness and headaches.  Hematological:  Does not bruise/bleed easily.  All other systems reviewed and are negative.      Objective:   Physical Exam Vitals and nursing note reviewed.  Constitutional:      General: She is not in acute distress.    Appearance: Normal appearance. She is well-developed.  HENT:     Head: Normocephalic.     Right Ear: Tympanic membrane normal.     Left Ear: Tympanic  membrane normal.     Nose: Nose normal.     Mouth/Throat:     Mouth: Mucous membranes are moist.  Eyes:     Pupils: Pupils are equal, round, and reactive to light.  Neck:     Vascular: No carotid bruit or JVD.  Cardiovascular:     Rate and Rhythm: Normal rate and regular rhythm.     Heart sounds: Normal heart sounds.  Pulmonary:     Effort: Pulmonary effort is normal. No respiratory distress.     Breath sounds: Normal breath sounds. No wheezing or rales.  Chest:     Chest wall: No tenderness.  Abdominal:     General: Bowel sounds are normal. There is no distension or abdominal bruit.     Palpations: Abdomen is soft. There is no hepatomegaly, splenomegaly, mass or pulsatile mass.     Tenderness: There is no abdominal tenderness.  Musculoskeletal:        General: Normal range of motion.     Cervical back: Normal range of motion and neck supple.  Lymphadenopathy:     Cervical: No cervical adenopathy.  Skin:    General: Skin is warm and dry.  Neurological:     Mental Status: She is alert and oriented to person, place, and time.     Deep Tendon Reflexes: Reflexes are normal and symmetric.  Psychiatric:        Behavior: Behavior normal.        Thought Content: Thought content normal.        Judgment: Judgment normal.    BP 115/81   Pulse 86   Temp (!) 97 F (36.1 C) (Temporal)   Resp 20   Ht _0  (1.676 m)   Wt 189 lb (85.7 kg)   SpO2 95%   BMI 30.51 kg/m         Assessment & Plan:   Brianna Sanchez comes in today with chief complaint of Medical Management of Chronic Issues   Diagnosis and orders addressed:  1. Mixed hyperlipidemia Low fat diet - atorvastatin (LIPITOR) 40 MG tablet; Take 1 tablet (40 mg total) by mouth daily.  Dispense: 90 tablet; Refill: 1  2. Recurrent major depressive disorder, in partial remission ([facility]) Stress management - venlafaxine XR (EFFEXOR-XR) 150 MG 24 hr capsule; Take 2 capsules (300 mg total) by mouth daily with breakfast.   Dispense: 180 capsule; Refill: 1  3. GAD (generalized  anxiety disorder) - ALPRAZolam (XANAX) 1 MG tablet; Take 1 tablet (1 mg total) by mouth 2 (two) times daily.  Dispense: 60 tablet; Refill: 5  4. BMI 32.0-32.9,adult Discussed diet and exercise for person with BMI >25 Will recheck weight in 3-6 months  Orders Placed This Encounter  Procedures   CBC with Differential/Platelet   CMP14+EGFR   Lipid panel     Labs pending Health Maintenance reviewed Diet and exercise encouraged  Follow up plan: 6 months   [place], FNP

## 2022-08-01 NOTE — Patient Instructions (Signed)

## 2022-08-02 LAB — CMP14+EGFR
ALT: 28 IU/L (ref 0–32)
AST: 21 IU/L (ref 0–40)
Albumin/Globulin Ratio: 1.9 (ref 1.2–2.2)
Albumin: 4.8 g/dL (ref 3.8–4.9)
Alkaline Phosphatase: 111 IU/L (ref 44–121)
BUN/Creatinine Ratio: 22 (ref 9–23)
BUN: 18 mg/dL (ref 6–24)
Bilirubin Total: 0.2 mg/dL (ref 0.0–1.2)
CO2: 23 mmol/L (ref 20–29)
Calcium: 9.8 mg/dL (ref 8.7–10.2)
Chloride: 102 mmol/L (ref 96–106)
Creatinine, Ser: 0.82 mg/dL (ref 0.57–1.00)
Globulin, Total: 2.5 g/dL (ref 1.5–4.5)
Glucose: 116 mg/dL — ABNORMAL HIGH (ref 70–99)
Potassium: 4.9 mmol/L (ref 3.5–5.2)
Sodium: 138 mmol/L (ref 134–144)
Total Protein: 7.3 g/dL (ref 6.0–8.5)
eGFR: 83 mL/min/{1.73_m2} (ref >59)

## 2022-08-02 LAB — LIPID PANEL
Chol/HDL Ratio: 3.8 ratio (ref 0.0–4.4)
Cholesterol, Total: 180 mg/dL (ref 100–199)
HDL: 48 mg/dL (ref >39)
LDL Chol Calc (NIH): 110 mg/dL — ABNORMAL HIGH (ref 0–99)
Triglycerides: 123 mg/dL (ref 0–149)
VLDL Cholesterol Cal: 22 mg/dL (ref 5–40)

## 2022-08-02 LAB — CBC WITH DIFFERENTIAL/PLATELET
Basophils Absolute: 0 10*3/uL (ref 0.0–0.2)
Basos: 1 %
EOS (ABSOLUTE): 0.1 10*3/uL (ref 0.0–0.4)
Eos: 2 %
Hematocrit: 44.6 % (ref 34.0–46.6)
Hemoglobin: 14.8 g/dL (ref 11.1–15.9)
Immature Grans (Abs): 0 10*3/uL (ref 0.0–0.1)
Immature Granulocytes: 0 %
Lymphocytes Absolute: 1.8 10*3/uL (ref 0.7–3.1)
Lymphs: 35 %
MCH: 29.6 pg (ref 26.6–33.0)
MCHC: 33.2 g/dL (ref 31.5–35.7)
MCV: 89 fL (ref 79–97)
Monocytes Absolute: 0.4 10*3/uL (ref 0.1–0.9)
Monocytes: 8 %
Neutrophils Absolute: 2.8 10*3/uL (ref 1.4–7.0)
Neutrophils: 54 %
Platelets: 390 10*3/uL (ref 150–450)
RBC: 5 x10E6/uL (ref 3.77–5.28)
RDW: 12.7 % (ref 11.7–15.4)
WBC: 5.1 10*3/uL (ref 3.4–10.8)

## 2022-08-05 ENCOUNTER — Other Ambulatory Visit: Payer: Self-pay | Admitting: Nurse Practitioner

## 2022-08-05 DIAGNOSIS — F411 Generalized anxiety disorder: Secondary | ICD-10-CM

## 2022-08-08 ENCOUNTER — Other Ambulatory Visit: Payer: Self-pay | Admitting: Nurse Practitioner

## 2022-08-08 DIAGNOSIS — F3341 Major depressive disorder, recurrent, in partial remission: Secondary | ICD-10-CM

## 2022-08-08 DIAGNOSIS — E782 Mixed hyperlipidemia: Secondary | ICD-10-CM

## 2022-11-29 ENCOUNTER — Ambulatory Visit (INDEPENDENT_AMBULATORY_CARE_PROVIDER_SITE_OTHER): Payer: Medicare HMO

## 2022-11-29 VITALS — Ht 66.0 in | Wt 200.0 lb

## 2022-11-29 DIAGNOSIS — Z0000 Encounter for general adult medical examination without abnormal findings: Secondary | ICD-10-CM | POA: Diagnosis not present

## 2022-11-29 DIAGNOSIS — Z0100 Encounter for examination of eyes and vision without abnormal findings: Secondary | ICD-10-CM

## 2022-11-29 DIAGNOSIS — Z1231 Encounter for screening mammogram for malignant neoplasm of breast: Secondary | ICD-10-CM

## 2022-11-29 NOTE — Patient Instructions (Signed)
Brianna Sanchez , Thank you for taking time to come for your Medicare Wellness Visit. I appreciate your ongoing commitment to your health goals. Please review the following plan we discussed and let me know if I can assist you in the future.   These are the goals we discussed:  Goals      DIET - REDUCE CALORIE INTAKE     Pt would like to lose weight.     Patient Stated     08/21/2019 AWV Goal: Exercise for General Health  Patient will verbalize understanding of the benefits of increased physical activity: Exercising regularly is important. It will improve your overall fitness, flexibility, and endurance. Regular exercise also will improve your overall health. It can help you control your weight, reduce stress, and improve your bone density. Over the next year, patient will increase physical activity as tolerated with a goal of at least 150 minutes of moderate physical activity per week.  You can tell that you are exercising at a moderate intensity if your heart starts beating faster and you start breathing faster but can still hold a conversation. Moderate-intensity exercise ideas include: Walking 1 mile (1.6 km) in about 15 minutes Biking Hiking Golfing Dancing Water aerobics Patient will verbalize understanding of everyday activities that increase physical activity by providing examples like the following: Yard work, such as: Sales promotion account executive Gardening Washing windows or floors Patient will be able to explain general safety guidelines for exercising:  Before you start a new exercise program, talk with your health care provider. Do not exercise so much that you hurt yourself, feel dizzy, or get very short of breath. Wear comfortable clothes and wear shoes with good support. Drink plenty of water while you exercise to prevent dehydration or heat stroke. Work out until your breathing and your heartbeat  get faster.  08/21/2019 AWV Goal: Tobacco Cessation  Smoking cessation instruction/counseling given:  counseled patient on the dangers of tobacco use, advised patient to stop smoking, and reviewed strategies to maximize success  Patient will verbalize understanding of the health risks associated with smoking/tobacco use Lung cancer or lung disease, such as COPD Heart disease. Stroke. Heart attack Infertility Osteoporosis and bone fractures. Patient will create a plan to quit smoking/using tobacco Pick a date to quit.  Write down the reasons why you are quitting and put it where you will see it often. Identify the people, places, things, and activities that make you want to smoke (triggers) and avoid them. Make sure to take these actions: Throw away all cigarettes at home, at work, and in your car. Throw away smoking accessories, such as Scientist, research (medical). Clean your car and make sure to empty the ashtray. Clean your home, including curtains and carpets. Tell your family, friends, and coworkers that you are quitting. Support from your loved ones can make quitting easier. Talk with your health care provider about your options for quitting smoking. Find out what treatment options are covered by your health insurance. Patient will be able to demonstrate knowledge of tobacco cessation strategies that may maximize success Quitting "cold [place]" is more successful than gradually quitting. Attending in-person counseling to help you build problem-solving skills.  Finding resources and support systems that can help you to quit smoking and remain smoke-free after you quit. These resources are most helpful when you use them often. They can include: Online chats with a Social worker. Telephone quitlines. Printed Furniture conservator/restorer. Support  groups or group counseling. Text messaging programs. Mobile phone applications. Taking medicines to help you quit smoking: Nicotine patches, gum, or  lozenges. Nicotine inhalers or sprays. Non-nicotine medicine that is taken by mouth. Patient will note get discouraged if the process is difficult Over the next year, patient will stop smoking or using other forms of tobacco  Smoking cessation instruction/counseling given:  counseled patient on the dangers of tobacco use, advised patient to stop smoking, and reviewed strategies to maximize success          This is a list of the screening recommended for you and due dates:  Health Maintenance  Topic Date Due   COVID-19 Vaccine (1) Never done   Zoster (Shingles) Vaccine (1 of 2) Never done   DTaP/Tdap/Td vaccine (2 - Tdap) 04/16/2000   Screening for Lung Cancer  Never done   Flu Shot  07/19/2022   Mammogram  11/19/2022   DEXA scan (bone density measurement)  01/31/2023*   Medicare Annual Wellness Visit  11/30/2023   Pap Smear  01/30/2024   Colon Cancer Screening  07/06/2025   Hepatitis C Screening: USPSTF Recommendation to screen - Ages 18-79 yo.  Completed   HIV Screening  Completed   HPV Vaccine  Aged Out  *Topic was postponed. The date shown is not the original due date.    Advanced directives: Advance directive discussed with you today. I have provided a copy for you to complete at home and have notarized. Once this is complete please bring a copy in to our office so we can scan it into your chart.   Conditions/risks identified: Aim for 30 minutes of exercise or brisk walking, 6-8 glasses of water, and 5 servings of fruits and vegetables each day.   Next appointment: Follow up in one year for your annual wellness visit.   Preventive Care 40-64 Years, Female Preventive care refers to lifestyle choices and visits with your health care provider that can promote health and wellness. What does preventive care include? A yearly physical exam. This is also called an annual well check. Dental exams once or twice a year. Routine eye exams. Ask your health care provider how often you  should have your eyes checked. Personal lifestyle choices, including: Daily care of your teeth and gums. Regular physical activity. Eating a healthy diet. Avoiding tobacco and drug use. Limiting alcohol use. Practicing safe sex. Taking low-dose aspirin daily starting at age 77. Taking vitamin and mineral supplements as recommended by your health care provider. What happens during an annual well check? The services and screenings done by your health care provider during your annual well check will depend on your age, overall health, lifestyle risk factors, and family history of disease. Counseling  Your health care provider may ask you questions about your: Alcohol use. Tobacco use. Drug use. Emotional well-being. Home and relationship well-being. Sexual activity. Eating habits. Work and work Statistician. Method of birth control. Menstrual cycle. Pregnancy history. Screening  You may have the following tests or measurements: Height, weight, and BMI. Blood pressure. Lipid and cholesterol levels. These may be checked every 5 years, or more frequently if you are over 66 years old. Skin check. Lung cancer screening. You may have this screening every year starting at age 89 if you have a 30-pack-year history of smoking and currently smoke or have quit within the past 15 years. Fecal occult blood test (FOBT) of the stool. You may have this test every year starting at age 8. Flexible sigmoidoscopy or  colonoscopy. You may have a sigmoidoscopy every 5 years or a colonoscopy every 10 years starting at age 50. Hepatitis C blood test. Hepatitis B blood test. Sexually transmitted disease (STD) testing. Diabetes screening. This is done by checking your blood sugar (glucose) after you have not eaten for a while (fasting). You may have this done every 1-3 years. Mammogram. This may be done every 1-2 years. Talk to your health care provider about when you should start having regular mammograms.  This may depend on whether you have a family history of breast cancer. BRCA-related cancer screening. This may be done if you have a family history of breast, ovarian, tubal, or peritoneal cancers. Pelvic exam and Pap test. This may be done every 3 years starting at age 77. Starting at age 19, this may be done every 5 years if you have a Pap test in combination with an HPV test. Bone density scan. This is done to screen for osteoporosis. You may have this scan if you are at high risk for osteoporosis. Discuss your test results, treatment options, and if necessary, the need for more tests with your health care provider. Vaccines  Your health care provider may recommend certain vaccines, such as: Influenza vaccine. This is recommended every year. Tetanus, diphtheria, and acellular pertussis (Tdap, Td) vaccine. You may need a Td booster every 10 years. Zoster vaccine. You may need this after age 64. Pneumococcal 13-valent conjugate (PCV13) vaccine. You may need this if you have certain conditions and were not previously vaccinated. Pneumococcal polysaccharide (PPSV23) vaccine. You may need one or two doses if you smoke cigarettes or if you have certain conditions. Talk to your health care provider about which screenings and vaccines you need and how often you need them. This information is not intended to replace advice given to you by your health care provider. Make sure you discuss any questions you have with your health care provider. Document Released: 01/01/2016 Document Revised: 08/24/2016 Document Reviewed: 10/06/2015 Elsevier Interactive Patient Education  2017 [employer] Prevention in the Home Falls can cause injuries. They can happen to people of all ages. There are many things you can do to make your home safe and to help prevent falls. What can I do on the outside of my home? Regularly fix the edges of walkways and driveways and fix any cracks. Remove anything that might  make you trip as you walk through a door, such as a raised step or threshold. Trim any bushes or trees on the path to your home. Use bright outdoor lighting. Clear any walking paths of anything that might make someone trip, such as rocks or tools. Regularly check to see if handrails are loose or broken. Make sure that both sides of any steps have handrails. Any raised decks and porches should have guardrails on the edges. Have any leaves, snow, or ice cleared regularly. Use sand or salt on walking paths during winter. Clean up any spills in your garage right away. This includes oil or grease spills. What can I do in the bathroom? Use night lights. Install grab bars by the toilet and in the tub and shower. Do not use towel bars as grab bars. Use non-skid mats or decals in the tub or shower. If you need to sit down in the shower, use a plastic, non-slip stool. Keep the floor dry. Clean up any water that spills on the floor as soon as it happens. Remove soap buildup in the tub or  shower regularly. Attach bath mats securely with double-sided non-slip rug tape. Do not have throw rugs and other things on the floor that can make you trip. What can I do in the bedroom? Use night lights. Make sure that you have a light by your bed that is easy to reach. Do not use any sheets or blankets that are too big for your bed. They should not hang down onto the floor. Have a firm chair that has side arms. You can use this for support while you get dressed. Do not have throw rugs and other things on the floor that can make you trip. What can I do in the kitchen? Clean up any spills right away. Avoid walking on wet floors. Keep items that you use a lot in easy-to-reach places. If you need to reach something above you, use a strong step stool that has a grab bar. Keep electrical cords out of the way. Do not use floor polish or wax that makes floors slippery. If you must use wax, use non-skid floor wax. Do  not have throw rugs and other things on the floor that can make you trip. What can I do with my stairs? Do not leave any items on the stairs. Make sure that there are handrails on both sides of the stairs and use them. Fix handrails that are broken or loose. Make sure that handrails are as long as the stairways. Check any carpeting to make sure that it is firmly attached to the stairs. Fix any carpet that is loose or worn. Avoid having throw rugs at the top or bottom of the stairs. If you do have throw rugs, attach them to the floor with carpet tape. Make sure that you have a light switch at the top of the stairs and the bottom of the stairs. If you do not have them, ask someone to add them for you. What else can I do to help prevent falls? Wear shoes that: Do not have high heels. Have rubber bottoms. Are comfortable and fit you well. Are closed at the toe. Do not wear sandals. If you use a stepladder: Make sure that it is fully opened. Do not climb a closed stepladder. Make sure that both sides of the stepladder are locked into place. Ask someone to hold it for you, if possible. Clearly mark and make sure that you can see: Any grab bars or handrails. First and last steps. Where the edge of each step is. Use tools that help you move around (mobility aids) if they are needed. These include: Canes. Walkers. Scooters. Crutches. Turn on the lights when you go into a dark area. Replace any light bulbs as soon as they burn out. Set up your furniture so you have a clear path. Avoid moving your furniture around. If any of your floors are uneven, fix them. If there are any pets around you, be aware of where they are. Review your medicines with your doctor. Some medicines can make you feel dizzy. This can increase your chance of falling. Ask your doctor what other things that you can do to help prevent falls. This information is not intended to replace advice given to you by your health care  provider. Make sure you discuss any questions you have with your health care provider. Document Released: 10/01/2009 Document Revised: 05/12/2016 Document Reviewed: 01/09/2015 Elsevier Interactive Patient Education  2017 [employer].

## 2022-11-29 NOTE — Progress Notes (Signed)
Subjective:   Brianna Sanchez is a 56 y.o. female who presents for Medicare Annual (Subsequent) preventive examination. I connected with  Brianna Sanchez on 11/29/22 by a audio enabled telemedicine application and verified that I am speaking with the correct person using two identifiers.  Patient Location: Home  Provider Location: Home Office  I discussed the limitations of evaluation and management by telemedicine. The patient expressed understanding and agreed to proceed.  Review of Systems     Cardiac Risk Factors include: advanced age (>79mn, >>48women)     Objective:    Today's Vitals   11/29/22 0826  Weight: 200 lb (90.7 kg)  Height: '5\' 6"'$  (1.676 m)   Body mass index is 32.28 kg/m.     11/29/2022    8:31 AM 10/27/2021    8:23 AM 08/21/2019   10:58 AM 12/17/2015    2:08 PM  Advanced Directives  Does Patient Have a Medical Advance Directive? No No No No  Would patient like information on creating a medical advance directive? No - Patient declined No - Patient declined No - Patient declined No - patient declined information    Current Medications (verified) Outpatient Encounter Medications as of 11/29/2022  Medication Sig   ALPRAZolam (XANAX) 1 MG tablet Take 1 tablet (1 mg total) by mouth 2 (two) times daily.   atorvastatin (LIPITOR) 40 MG tablet Take 1 tablet (40 mg total) by mouth daily.   venlafaxine XR (EFFEXOR-XR) 150 MG 24 hr capsule Take 2 capsules (300 mg total) by mouth daily with breakfast.   No facility-administered encounter medications on file as of 11/29/2022.    Allergies (verified) Codeine and Medroxyprogesterone   History: Past Medical History:  Diagnosis Date   Depression    GAD (generalized anxiety disorder)    Hyperlipidemia    Past Surgical History:  Procedure Laterality Date   hemrrhoid     Family History  Problem Relation Age of Onset   Cancer Father        stomach   Diabetes Father    Heart attack Father    Heart disease  Father    Hyperlipidemia Brother    Breast cancer Neg Hx    Social History   Socioeconomic History   Marital status: Married    Spouse name: Brianna Sanchez   Number of children: 2   Years of education: Not on file   Highest education level: GED or equivalent  Occupational History   Occupation: disable  Tobacco Use   Smoking status: Every Day    Packs/day: 1.00    Years: 20.00    Total pack years: 20.00    Types: E-cigarettes, Cigarettes   Smokeless tobacco: Never  Vaping Use   Vaping Use: Former  Substance and Sexual Activity   Alcohol use: No   Drug use: No   Sexual activity: Not on file  Other Topics Concern   Not on file  Social History Narrative   1 daughter and 1 son   2 grandchildren   Social Determinants of Health   Financial Resource Strain: Low Risk  (11/29/2022)   Overall Financial Resource Strain (CARDIA)    Difficulty of Paying Living Expenses: Not hard at all  Food Insecurity: No Food Insecurity (11/29/2022)   Hunger Vital Sign    Worried About Running Out of Food in the Last Year: Never true    Ran Out of Food in the Last Year: Never true  Transportation Needs: No Transportation Needs (11/29/2022)   PRAPARE -  Hydrologist (Medical): No    Lack of Transportation (Non-Medical): No  Physical Activity: Inactive (11/29/2022)   Exercise Vital Sign    Days of Exercise per Week: 0 days    Minutes of Exercise per Session: 0 min  Stress: No Stress Concern Present (11/29/2022)   [place]    Feeling of Stress : Not at all  Social Connections: Moderately Isolated (11/29/2022)   Social Connection and Isolation Panel [NHANES]    Frequency of Communication with Friends and Family: More than three times a week    Frequency of Social Gatherings with Friends and Family: More than three times a week    Attends Religious Services: Never    Marine scientist or  Organizations: No    Attends Music therapist: Never    Marital Status: Married    Tobacco Counseling Ready to quit: Not Answered Counseling given: Not Answered   Clinical Intake:  Pre-visit preparation completed: Yes  Pain : No/denies pain     Nutritional Risks: None Diabetes: No  How often do you need to have someone help you when you read instructions, pamphlets, or other written materials from your doctor or pharmacy?: 1 - Never  Diabetic?no   Interpreter Needed?: No  Information entered by :: Jadene Pierini, LPN   Activities of Daily Living    11/29/2022    8:31 AM  In your present state of health, do you have any difficulty performing the following activities:  Hearing? 0  Vision? 0  Difficulty concentrating or making decisions? 0  Walking or climbing stairs? 0  Dressing or bathing? 0  Doing errands, shopping? 0  Preparing Food and eating ? N  Using the Toilet? N  In the past six months, have you accidently leaked urine? N  Do you have problems with loss of bowel control? N  Managing your Medications? N  Managing your Finances? N  Housekeeping or managing your Housekeeping? N    Patient Care Team: Brianna Pretty, FNP as PCP - General (Nurse Practitioner)  Indicate any recent Medical Services you may have received from other than [facility] providers in the past year (date may be approximate).     Assessment:   This is a routine wellness examination for [name].  Hearing/Vision screen Vision Screening - Comments:: Referral 11/29/2022  Dietary issues and exercise activities discussed: Current Exercise Habits: The patient does not participate in regular exercise at present   Goals Addressed             This Visit's Progress    DIET - REDUCE CALORIE INTAKE   On track    Pt would like to lose weight.       Depression Screen    11/29/2022    8:29 AM 08/01/2022    8:13 AM 01/31/2022    8:19 AM 10/27/2021    8:18 AM 07/29/2021     8:19 AM 01/29/2021    8:27 AM 07/27/2020    8:16 AM  PHQ 2/9 Scores  PHQ - 2 Score 0 4 3 0 4 2 0  PHQ- 9 Score  '17 15  17 6 '$ 0    Fall Risk    11/29/2022    8:28 AM 08/01/2022    8:13 AM 01/31/2022    8:19 AM 10/27/2021    8:24 AM 07/29/2021    8:19 AM  Fall Risk   Falls in the past year? 0 1  1 0 0  Number falls in past yr: 0 0 0 0   Injury with Fall? 0 0 0 0   Risk for fall due to : No Fall Risks History of fall(s) History of fall(s) Impaired vision   Follow up Falls prevention discussed Education provided Education provided Falls prevention discussed     [place]:  Any stairs in or around the home? No  If so, are there any without handrails? No  Home free of loose throw rugs in walkways, pet beds, electrical cords, etc? Yes  Adequate lighting in your home to reduce risk of falls? Yes   ASSISTIVE DEVICES UTILIZED TO PREVENT FALLS:  Life alert? No  Use of a cane, walker or w/c? No  Grab bars in the bathroom? No  Shower chair or bench in shower? No  Elevated toilet seat or a handicapped toilet? No       12/17/2015    2:11 PM  MMSE - Mini Mental State Exam  Orientation to time 5  Orientation to Place 5  Registration 3  Attention/ Calculation 2  Recall 2  Language- name 2 objects 2  Language- repeat 1  Language- follow 3 step command 3  Language- read & follow direction 1  Write a sentence 1  Copy design 1  Total score 26        11/29/2022    8:32 AM 10/27/2021    8:28 AM 08/21/2019   11:01 AM  6CIT Screen  What Year? 0 points 0 points 0 points  What month? 0 points 0 points 0 points  What time? 0 points 0 points 0 points  Count back from 20 0 points 0 points 0 points  Months in reverse 0 points 0 points 0 points  Repeat phrase 0 points 4 points 6 points  Total Score 0 points 4 points 6 points    Immunizations Immunization History  Administered Date(s) Administered   Influenza,inj,Quad PF,6+ Mos 12/17/2015, 12/21/2016,  11/10/2017, 10/26/2018, 01/29/2021   Influenza-Unspecified 10/24/2017, 10/24/2018   Td 04/16/1990    TDAP status: Due, Education has been provided regarding the importance of this vaccine. Advised may receive this vaccine at local pharmacy or Health Dept. Aware to provide a copy of the vaccination record if obtained from local pharmacy or Health Dept. Verbalized acceptance and understanding.  Flu Vaccine status: Due, Education has been provided regarding the importance of this vaccine. Advised may receive this vaccine at local pharmacy or Health Dept. Aware to provide a copy of the vaccination record if obtained from local pharmacy or Health Dept. Verbalized acceptance and understanding.  Pneumococcal vaccine status: Due, Education has been provided regarding the importance of this vaccine. Advised may receive this vaccine at local pharmacy or Health Dept. Aware to provide a copy of the vaccination record if obtained from local pharmacy or Health Dept. Verbalized acceptance and understanding.  Covid-19 vaccine status: Declined, Education has been provided regarding the importance of this vaccine but patient still declined. Advised may receive this vaccine at local pharmacy or Health Dept.or vaccine clinic. Aware to provide a copy of the vaccination record if obtained from local pharmacy or Health Dept. Verbalized acceptance and understanding.  Qualifies for Shingles Vaccine? Yes   Zostavax completed No   Shingrix Completed?: No.    Education has been provided regarding the importance of this vaccine. Patient has been advised to call insurance company to determine out of pocket expense if they have not yet received this  vaccine. Advised may also receive vaccine at local pharmacy or Health Dept. Verbalized acceptance and understanding.  Screening Tests Health Maintenance  Topic Date Due   COVID-19 Vaccine (1) Never done   Zoster Vaccines- Shingrix (1 of 2) Never done   DTaP/Tdap/Td (2 - Tdap)  04/16/2000   Lung Cancer Screening  Never done   INFLUENZA VACCINE  07/19/2022   MAMMOGRAM  11/19/2022   DEXA SCAN  01/31/2023 (Originally 01/26/2022)   Medicare Annual Wellness (AWV)  11/30/2023   PAP SMEAR-Modifier  01/30/2024   COLONOSCOPY (Pts 45-43yr Insurance coverage will need to be confirmed)  07/06/2025   Hepatitis C Screening  Completed   HIV Screening  Completed   HPV VACCINES  Aged Out    Health Maintenance  Health Maintenance Due  Topic Date Due   COVID-19 Vaccine (1) Never done   Zoster Vaccines- Shingrix (1 of 2) Never done   DTaP/Tdap/Td (2 - Tdap) 04/16/2000   Lung Cancer Screening  Never done   INFLUENZA VACCINE  07/19/2022   MAMMOGRAM  11/19/2022    Colorectal cancer screening: Type of screening: Colonoscopy. Completed 07/07/2015. Repeat every 10 years  Mammogram status: Ordered 11/29/2022. Pt provided with contact info and advised to call to schedule appt.   Bone Density status: Ordered not of age . Pt provided with contact info and advised to call to schedule appt.  Lung Cancer Screening: (Low Dose CT Chest recommended if Age 57-80years, 30 pack-year currently smoking OR have quit w/in 15years.) does not qualify.   Lung Cancer Screening Referral: n/a  Additional Screening:  Hepatitis C Screening: does not qualify; Completed 03/03/2016  Vision Screening: Recommended annual ophthalmology exams for early detection of glaucoma and other disorders of the eye. Is the patient up to date with their annual eye exam?  No  Who is the provider or what is the name of the office in which the patient attends annual eye exams? Referral 11/29/2022 If pt is not established with a provider, would they like to be referred to a provider to establish care? No .   Dental Screening: Recommended annual dental exams for proper oral hygiene  Community Resource Referral / Chronic Care Management: CRR required this visit?  No   CCM required this visit?  No      Plan:      I have personally reviewed and noted the following in the patient's chart:   Medical and social history Use of alcohol, tobacco or illicit drugs  Current medications and supplements including opioid prescriptions. Patient is not currently taking opioid prescriptions. Functional ability and status Nutritional status Physical activity Advanced directives List of other physicians Hospitalizations, surgeries, and ER visits in previous 12 months Vitals Screenings to include cognitive, depression, and falls Referrals and appointments  In addition, I have reviewed and discussed with patient certain preventive protocols, quality metrics, and best practice recommendations. A written personalized care plan for preventive services as well as general preventive health recommendations were provided to patient.     LDaphane Shepherd LPN   [id number]/00/7121  Nurse Notes: No vaccines on file

## 2022-12-26 ENCOUNTER — Ambulatory Visit
Admission: RE | Admit: 2022-12-26 | Discharge: 2022-12-26 | Disposition: A | Discharge status: Home / Self Care | Payer: Medicare HMO | Source: Ambulatory Visit | Attending: Nurse Practitioner | Admitting: Nurse Practitioner

## 2022-12-26 ENCOUNTER — Other Ambulatory Visit: Payer: Self-pay | Admitting: Nurse Practitioner

## 2022-12-26 DIAGNOSIS — Z1231 Encounter for screening mammogram for malignant neoplasm of breast: Secondary | ICD-10-CM

## 2022-12-26 DIAGNOSIS — Z0000 Encounter for general adult medical examination without abnormal findings: Secondary | ICD-10-CM

## 2022-12-26 DIAGNOSIS — Z0100 Encounter for examination of eyes and vision without abnormal findings: Secondary | ICD-10-CM

## 2023-02-02 ENCOUNTER — Ambulatory Visit: Payer: Medicare HMO | Admitting: Nurse Practitioner

## 2023-02-05 ENCOUNTER — Other Ambulatory Visit: Payer: Self-pay | Admitting: Nurse Practitioner

## 2023-02-05 DIAGNOSIS — E782 Mixed hyperlipidemia: Secondary | ICD-10-CM

## 2023-02-05 DIAGNOSIS — F3341 Major depressive disorder, recurrent, in partial remission: Secondary | ICD-10-CM

## 2023-02-09 ENCOUNTER — Other Ambulatory Visit: Payer: Self-pay | Admitting: Nurse Practitioner

## 2023-02-09 DIAGNOSIS — F411 Generalized anxiety disorder: Secondary | ICD-10-CM

## 2023-02-13 ENCOUNTER — Ambulatory Visit (INDEPENDENT_AMBULATORY_CARE_PROVIDER_SITE_OTHER): Payer: Medicare HMO | Admitting: Nurse Practitioner

## 2023-02-13 ENCOUNTER — Encounter: Payer: Self-pay | Admitting: Nurse Practitioner

## 2023-02-13 ENCOUNTER — Ambulatory Visit (INDEPENDENT_AMBULATORY_CARE_PROVIDER_SITE_OTHER): Payer: Medicare HMO

## 2023-02-13 VITALS — BP 110/77 | HR 71 | Temp 97.6°F | Resp 20 | Ht 66.0 in | Wt 185.0 lb

## 2023-02-13 DIAGNOSIS — F3341 Major depressive disorder, recurrent, in partial remission: Secondary | ICD-10-CM | POA: Diagnosis not present

## 2023-02-13 DIAGNOSIS — F411 Generalized anxiety disorder: Secondary | ICD-10-CM

## 2023-02-13 DIAGNOSIS — E782 Mixed hyperlipidemia: Secondary | ICD-10-CM

## 2023-02-13 DIAGNOSIS — Z6832 Body mass index (BMI) 32.0-32.9, adult: Secondary | ICD-10-CM | POA: Diagnosis not present

## 2023-02-13 DIAGNOSIS — Z0000 Encounter for general adult medical examination without abnormal findings: Secondary | ICD-10-CM

## 2023-02-13 DIAGNOSIS — Z111 Encounter for screening for respiratory tuberculosis: Secondary | ICD-10-CM | POA: Diagnosis not present

## 2023-02-13 DIAGNOSIS — Z0001 Encounter for general adult medical examination with abnormal findings: Secondary | ICD-10-CM | POA: Diagnosis not present

## 2023-02-13 DIAGNOSIS — Z136 Encounter for screening for cardiovascular disorders: Secondary | ICD-10-CM

## 2023-02-13 DIAGNOSIS — J929 Pleural plaque without asbestos: Secondary | ICD-10-CM | POA: Diagnosis not present

## 2023-02-13 MED ORDER — VENLAFAXINE HCL ER 150 MG PO CP24: ORAL_CAPSULE | ORAL | 1 refills | Status: DC

## 2023-02-13 MED ORDER — ALPRAZOLAM 1 MG PO TABS: 1.0000 mg | ORAL_TABLET | Freq: Two times a day (BID) | ORAL | 5 refills | Status: DC

## 2023-02-13 MED ORDER — ATORVASTATIN CALCIUM 40 MG PO TABS: 40.0000 mg | ORAL_TABLET | Freq: Every day | ORAL | 1 refills | Status: DC

## 2023-02-13 NOTE — Patient Instructions (Signed)
Exercising to Stay Healthy To become healthy and stay healthy, it is recommended that you do moderate-intensity and vigorous-intensity exercise. You can tell that you are exercising at a moderate intensity if your heart starts beating faster and you start breathing faster but can still hold a conversation. You can tell that you are exercising at a vigorous intensity if you are breathing much harder and faster and cannot hold a conversation while exercising. How can exercise benefit me? Exercising regularly is important. It has many health benefits, such as: Improving overall fitness, flexibility, and endurance. Increasing bone density. Helping with weight control. Decreasing body fat. Increasing muscle strength and endurance. Reducing stress and tension, anxiety, depression, or anger. Improving overall health. What guidelines should I follow while exercising? Before you start a new exercise program, talk with your health care provider. Do not exercise so much that you hurt yourself, feel dizzy, or get very short of breath. Wear comfortable clothes and wear shoes with good support. Drink plenty of water while you exercise to prevent dehydration or heat stroke. Work out until your breathing and your heartbeat get faster (moderate intensity). How often should I exercise? Choose an activity that you enjoy, and set realistic goals. Your health care provider can help you make an activity plan that is individually designed and works best for you. Exercise regularly as told by your health care provider. This may include: Doing strength training two times a week, such as: Lifting weights. Using resistance bands. Push-ups. Sit-ups. Yoga. Doing a certain intensity of exercise for a given amount of time. Choose from these options: A total of 150 minutes of moderate-intensity exercise every week. A total of 75 minutes of vigorous-intensity exercise every week. A mix of moderate-intensity and  vigorous-intensity exercise every week. Children, pregnant women, people who have not exercised regularly, people who are overweight, and older adults may need to talk with a health care provider about what activities are safe to perform. If you have a medical condition, be sure to talk with your health care provider before you start a new exercise program. What are some exercise ideas? Moderate-intensity exercise ideas include: Walking 1 mile (1.6 km) in about 15 minutes. Biking. Hiking. Golfing. Dancing. Water aerobics. Vigorous-intensity exercise ideas include: Walking 4.5 miles (7.2 km) or more in about 1 hour. Jogging or running 5 miles (8 km) in about 1 hour. Biking 10 miles (16.1 km) or more in about 1 hour. Lap swimming. Roller-skating or in-line skating. Cross-country skiing. Vigorous competitive sports, such as football, basketball, and soccer. Jumping rope. Aerobic dancing. What are some everyday activities that can help me get exercise? Yard work, such as: Pushing a lawn mower. Raking and bagging leaves. Washing your car. Pushing a stroller. Shoveling snow. Gardening. Washing windows or floors. How can I be more active in my day-to-day activities? Use stairs instead of an elevator. Take a walk during your lunch break. If you drive, park your car farther away from your work or school. If you take public transportation, get off one stop early and walk the rest of the way. Stand up or walk around during all of your indoor phone calls. Get up, stretch, and walk around every 30 minutes throughout the day. Enjoy exercise with a friend. Support to continue exercising will help you keep a regular routine of activity. Where to find more information You can find more information about exercising to stay healthy from: U.[place] Department of Health and Human Services: www.hhs.gov Centers for Disease Control and Prevention (  CDC): www.cdc.gov Summary Exercising regularly is  important. It will improve your overall fitness, flexibility, and endurance. Regular exercise will also improve your overall health. It can help you control your weight, reduce stress, and improve your bone density. Do not exercise so much that you hurt yourself, feel dizzy, or get very short of breath. Before you start a new exercise program, talk with your health care provider. This information is not intended to replace advice given to you by your health care provider. Make sure you discuss any questions you have with your health care provider. Document Revised: 04/02/2021 Document Reviewed: 04/02/2021 Elsevier Patient Education  2023 Elsevier Inc.  

## 2023-02-13 NOTE — Progress Notes (Signed)
Subjective:    Patient ID: Brianna Sanchez, female    DOB: July 07, 1966, 57 y.o.   MRN: [id number]   Chief Complaint: annual physical   HPI:  Brianna Sanchez is a 57 y.o. who identifies as a female who was assigned female at birth.   Social history: Lives with: husband Work history: disability   Comes in today for follow up of the following chronic medical issues:  1. Mixed hyperlipidemia Does not really watch diet and does no exercise. BP Readings from Last 3 Encounters:  08/01/22 115/81  03/17/22 125/86  01/31/22 121/84     2. Recurrent major depressive disorder, in partial remission ([place]) Is on effexor daily and says she is doing OK.    02/13/2023    9:42 AM 11/29/2022    8:29 AM 08/01/2022    8:13 AM  Depression screen PHQ 2/9  Decreased Interest 1 0 2  Down, Depressed, Hopeless 1 0 2  PHQ - 2 Score 2 0 4  Altered sleeping 2  3  Tired, decreased energy 2  1  Change in appetite 2  2  Feeling bad or failure about yourself  3  2  Trouble concentrating 1  2  Moving slowly or fidgety/restless 2  2  Suicidal thoughts 1  1  PHQ-9 Score 15  17  Difficult doing work/chores Somewhat difficult  Somewhat difficult     3. GAD (generalized anxiety disorder) Is on xanax BID and cannot go without it.    02/13/2023    9:43 AM 08/01/2022    8:13 AM 01/31/2022    8:19 AM 07/29/2021    8:20 AM  GAD 7 : Generalized Anxiety Score  Nervous, Anxious, on Edge '2 2 3 1  '$ Control/stop worrying '2 3 3 1  '$ Worry too much - different things '2 3 3 1  '$ Trouble relaxing '1 2 1 1  '$ Restless '1 1 1 1  '$ Easily annoyed or irritable '1 2 3 3  '$ Afraid - awful might happen '3 3 3 3  '$ Total GAD 7 Score '12 16 17 11  '$ Anxiety Difficulty Somewhat difficult Very difficult Extremely difficult Extremely difficult      4. BMI 32.0-32.9,adult Eight is down 15 lbs Wt Readings from Last 3 Encounters:  02/13/23 185 lb (83.9 kg)  11/29/22 200 lb (90.7 kg)  08/01/22 189 lb (85.7 kg)   BMI Readings from  Last 3 Encounters:  02/13/23 29.86 kg/m  11/29/22 32.28 kg/m  08/01/22 30.51 kg/m     New complaints: None today  Allergies  Allergen Reactions   Codeine    Medroxyprogesterone    Outpatient Encounter Medications as of 02/13/2023  Medication Sig   ALPRAZolam (XANAX) 1 MG tablet Take 1 tablet (1 mg total) by mouth 2 (two) times daily.   atorvastatin (LIPITOR) 40 MG tablet TAKE ONE TABLET ONCE DAILY   venlafaxine XR (EFFEXOR-XR) 150 MG 24 hr capsule TAKE TWO CAPSULES ONCE DAILY WITH BREAKFAST   No facility-administered encounter medications on file as of 02/13/2023.    Past Surgical History:  Procedure Laterality Date   hemrrhoid      Family History  Problem Relation Age of Onset   Cancer Father        stomach   Diabetes Father    Heart attack Father    Heart disease Father    Hyperlipidemia Brother    Breast cancer Neg Hx       Controlled substance contract: 02/13/23 drug screen today  Review of Systems  Constitutional:  Negative for diaphoresis.  Eyes:  Negative for pain.  Respiratory:  Negative for shortness of breath.   Cardiovascular:  Negative for chest pain, palpitations and leg swelling.  Gastrointestinal:  Negative for abdominal pain.  Endocrine: Negative for polydipsia.  Skin:  Negative for rash.  Neurological:  Negative for dizziness, weakness and headaches.  Hematological:  Does not bruise/bleed easily.  All other systems reviewed and are negative.      Objective:   Physical Exam Vitals and nursing note reviewed.  Constitutional:      General: She is not in acute distress.    Appearance: Normal appearance. She is well-developed.  HENT:     Head: Normocephalic.     Right Ear: Tympanic membrane normal.     Left Ear: Tympanic membrane normal.     Nose: Nose normal.     Mouth/Throat:     Mouth: Mucous membranes are moist.  Eyes:     Pupils: Pupils are equal, round, and reactive to light.  Neck:     Vascular: No carotid bruit or  JVD.  Cardiovascular:     Rate and Rhythm: Normal rate and regular rhythm.     Heart sounds: Normal heart sounds.  Pulmonary:     Effort: Pulmonary effort is normal. No respiratory distress.     Breath sounds: Normal breath sounds. No wheezing or rales.  Chest:     Chest wall: No tenderness.  Abdominal:     General: Bowel sounds are normal. There is no distension or abdominal bruit.     Palpations: Abdomen is soft. There is no hepatomegaly, splenomegaly, mass or pulsatile mass.     Tenderness: There is no abdominal tenderness.  Musculoskeletal:        General: Normal range of motion.     Cervical back: Normal range of motion and neck supple.  Lymphadenopathy:     Cervical: No cervical adenopathy.  Skin:    General: Skin is warm and dry.  Neurological:     Mental Status: She is alert and oriented to person, place, and time.     Deep Tendon Reflexes: Reflexes are normal and symmetric.  Psychiatric:        Behavior: Behavior normal.        Thought Content: Thought content normal.        Judgment: Judgment normal.     BP 110/77   Pulse 71   Temp 97.6 F (36.4 C) (Temporal)   Resp 20   Ht '5\' 6"'$  (1.676 m)   Wt 185 lb (83.9 kg)   SpO2 98%   BMI 29.86 kg/m   EKG- NSR     Assessment & Plan:   Brianna Sanchez comes in today with chief complaint of Annual Exam   Diagnosis and orders addressed:  1. Annual physical exam  2. Mixed hyperlipidemia Low sodium diet - atorvastatin (LIPITOR) 40 MG tablet; Take 1 tablet (40 mg total) by mouth daily.  Dispense: 90 tablet; Refill: 1  3. Recurrent major depressive disorder, in partial remission (HCC) Stress management  - venlafaxine XR (EFFEXOR-XR) 150 MG 24 hr capsule; TAKE TWO CAPSULES ONCE DAILY WITH BREAKFAST  Dispense: 180 capsule; Refill: 1  4. GAD (generalized anxiety disorder) - ToxASSURE Select 13 (MW), Urine - ALPRAZolam (XANAX) 1 MG tablet; Take 1 tablet (1 mg total) by mouth 2 (two) times daily.  Dispense: 60  tablet; Refill: 5  5. BMI 32.0-32.9,adult Discussed diet and exercise for person with BMI >  25 Will recheck weight in 3-6 months  Orders Placed This Encounter  Procedures   DG Chest 2 View    Order Specific Question:   Reason for Exam (SYMPTOM  OR DIAGNOSIS REQUIRED)    Answer:   screening    Order Specific Question:   Is the patient pregnant?    Answer:   No    Order Specific Question:   Preferred imaging location?    Answer:   Internal   ToxASSURE Select 13 (MW), Urine    Allergies as of 02/13/2023      Reactions   Codeine    Medroxyprogesterone       Medication List       Accurate as of February 13, 2023  9:45 AM. If you have any questions, ask  your nurse or doctor.        ALPRAZolam 1 MG tablet Commonly known as: XANAX Take 1 tablet (1 mg total) by mouth 2 (two) times daily.   atorvastatin 40 MG tablet Commonly known as: LIPITOR TAKE ONE TABLET ONCE DAILY   venlafaxine XR 150 MG 24 hr capsule Commonly known as: EFFEXOR-XR TAKE TWO CAPSULES ONCE DAILY WITH BREAKFAST   CBC with Differential/Platelet   CMP14+EGFR   Lipid panel   Thyroid Panel With TSH   EKG 12-Lead     Labs pending Health Maintenance reviewed Diet and exercise encouraged  Follow up plan: 6 months   [place], FNP

## 2023-02-14 LAB — LIPID PANEL
Chol/HDL Ratio: 2.9 ratio (ref 0.0–4.4)
Cholesterol, Total: 141 mg/dL (ref 100–199)
HDL: 48 mg/dL (ref >39)
LDL Chol Calc (NIH): 74 mg/dL (ref 0–99)
Triglycerides: 101 mg/dL (ref 0–149)
VLDL Cholesterol Cal: 19 mg/dL (ref 5–40)

## 2023-02-14 LAB — CMP14+EGFR
ALT: 16 IU/L (ref 0–32)
AST: 16 IU/L (ref 0–40)
Albumin/Globulin Ratio: 1.8 (ref 1.2–2.2)
Albumin: 4.5 g/dL (ref 3.8–4.9)
Alkaline Phosphatase: 125 IU/L — ABNORMAL HIGH (ref 44–121)
BUN/Creatinine Ratio: 16 (ref 9–23)
BUN: 12 mg/dL (ref 6–24)
Bilirubin Total: 0.3 mg/dL (ref 0.0–1.2)
CO2: 22 mmol/L (ref 20–29)
Calcium: 9.9 mg/dL (ref 8.7–10.2)
Chloride: 101 mmol/L (ref 96–106)
Creatinine, Ser: 0.77 mg/dL (ref 0.57–1.00)
Globulin, Total: 2.5 g/dL (ref 1.5–4.5)
Glucose: 94 mg/dL (ref 70–99)
Potassium: 4.8 mmol/L (ref 3.5–5.2)
Sodium: 140 mmol/L (ref 134–144)
Total Protein: 7 g/dL (ref 6.0–8.5)
eGFR: 90 mL/min/{1.73_m2} (ref >59)

## 2023-02-14 LAB — CBC WITH DIFFERENTIAL/PLATELET
Basophils Absolute: 0 10*3/uL (ref 0.0–0.2)
Basos: 1 %
EOS (ABSOLUTE): 0 10*3/uL (ref 0.0–0.4)
Eos: 1 %
Hematocrit: 40.5 % (ref 34.0–46.6)
Hemoglobin: 13.6 g/dL (ref 11.1–15.9)
Immature Grans (Abs): 0 10*3/uL (ref 0.0–0.1)
Immature Granulocytes: 0 %
Lymphocytes Absolute: 2 10*3/uL (ref 0.7–3.1)
Lymphs: 34 %
MCH: 29.9 pg (ref 26.6–33.0)
MCHC: 33.6 g/dL (ref 31.5–35.7)
MCV: 89 fL (ref 79–97)
Monocytes Absolute: 0.3 10*3/uL (ref 0.1–0.9)
Monocytes: 6 %
Neutrophils Absolute: 3.5 10*3/uL (ref 1.4–7.0)
Neutrophils: 58 %
Platelets: 420 10*3/uL (ref 150–450)
RBC: 4.55 x10E6/uL (ref 3.77–5.28)
RDW: 12.6 % (ref 11.7–15.4)
WBC: 5.9 10*3/uL (ref 3.4–10.8)

## 2023-02-14 LAB — THYROID PANEL WITH TSH
Free Thyroxine Index: 1.6 (ref 1.2–4.9)
T3 Uptake Ratio: 20 % — ABNORMAL LOW (ref 24–39)
T4, Total: 7.9 ug/dL (ref 4.5–12.0)
TSH: 1.19 u[IU]/mL (ref 0.450–4.500)

## 2023-02-15 LAB — TOXASSURE SELECT 13 (MW), URINE

## 2023-02-21 ENCOUNTER — Other Ambulatory Visit: Payer: Self-pay | Admitting: Nurse Practitioner

## 2023-02-21 ENCOUNTER — Other Ambulatory Visit (INDEPENDENT_AMBULATORY_CARE_PROVIDER_SITE_OTHER): Payer: Medicare HMO

## 2023-02-21 DIAGNOSIS — Z780 Asymptomatic menopausal state: Secondary | ICD-10-CM | POA: Diagnosis not present

## 2023-05-23 ENCOUNTER — Encounter: Payer: Self-pay | Admitting: Nurse Practitioner

## 2023-05-23 ENCOUNTER — Ambulatory Visit (INDEPENDENT_AMBULATORY_CARE_PROVIDER_SITE_OTHER): Payer: Medicare HMO | Admitting: Nurse Practitioner

## 2023-05-23 VITALS — BP 132/88 | HR 78 | Temp 97.0°F | Ht 66.0 in | Wt 183.8 lb

## 2023-05-23 DIAGNOSIS — T148XXA Other injury of unspecified body region, initial encounter: Secondary | ICD-10-CM | POA: Insufficient documentation

## 2023-05-23 DIAGNOSIS — S61401A Unspecified open wound of right hand, initial encounter: Secondary | ICD-10-CM | POA: Diagnosis not present

## 2023-05-23 DIAGNOSIS — F332 Major depressive disorder, recurrent severe without psychotic features: Secondary | ICD-10-CM | POA: Diagnosis not present

## 2023-05-23 DIAGNOSIS — F411 Generalized anxiety disorder: Secondary | ICD-10-CM | POA: Diagnosis not present

## 2023-05-23 DIAGNOSIS — Z23 Encounter for immunization: Secondary | ICD-10-CM | POA: Insufficient documentation

## 2023-05-23 NOTE — Progress Notes (Signed)
Acute Office Visit  Subjective:     Patient ID: Brianna Sanchez, female    DOB: 11-19-1966, 57 y.o.   MRN: [id number]  Chief Complaint  Patient presents with   Animal Bite    Got bit Sunday on right hand. Immediatly swelled and turned red. Hand is sore and hard to clench fist. Took advil yesterday (woke up with headache). soaked hand in epson salt last night and putting antibiotic ointment on it.   Brianna Sanchez is 57 yr old female seen today for an acute visit for cat bites. " I have been caring for a lot of feral cats, and finding them homes. Te last one had two babies and I have been petting her and play with her in the past with no issues. I found  a home for her, so I was trying to place her and her kittens in a carrier, and she wasn't happy about it. She is doing well wit her new family". Denies fever, N/V. Reports HA yesterday morning " every now and then I have a HA, but nothing new". She will need tetanus.   PHQ-9  score today: 17 currently on venlafaxine and Xanax, and did counseling in te past  " it was not helping, because I never made a good rapport with the counselor"" Discuss referring her to th critical care team 'I want to think about it". She will discuss it with her PCP at her next follow up visit  HPI  ROS Negative unless indicated in HPI    Objective:    BP 132/88   Pulse 78   Temp (!) 97 F (36.1 C) (Temporal)   Ht 5\' 6"  (1.676 m)   Wt 183 lb 12.8 oz (83.4 kg)   SpO2 96%   BMI 29.67 kg/m  BP Readings from Last 3 Encounters:  05/23/23 132/88  02/13/23 110/77  08/01/22 115/81   Wt Readings from Last 3 Encounters:  05/23/23 183 lb 12.8 oz (83.4 kg)  02/13/23 185 lb (83.9 kg)  11/29/22 200 lb (90.7 kg)      Physical Exam Constitutional:      General: She is not in acute distress.    Appearance: She is obese.  HENT:     Head: Normocephalic and atraumatic.  Eyes:     Extraocular Movements: Extraocular movements intact.     Conjunctiva/sclera:  Conjunctivae normal.     Pupils: Pupils are equal, round, and reactive to light.  Cardiovascular:     Rate and Rhythm: Normal rate and regular rhythm.  Pulmonary:     Effort: Pulmonary effort is normal.     Breath sounds: Normal breath sounds.  Musculoskeletal:     Right hand: Tenderness present. No bony tenderness.     Left hand: Normal.     Comments: 1x2cm wound that is healing well  Skin:    General: Skin is warm and dry.  Neurological:     General: No focal deficit present.     Mental Status: She is oriented to person, place, and time. Mental status is at baseline.  Psychiatric:        Mood and Affect: Mood normal.        Behavior: Behavior normal.        Thought Content: Thought content normal.        Judgment: Judgment normal.     No results found for any visits on 05/23/23.      Assessment & Plan:  Need for Tdap vaccination -  Tdap vaccine greater than or equal to 7yo IM  Animal bites  Severe episode of recurrent major depressive disorder, without psychotic features (HCC)  GAD (generalized anxiety disorder)   Animal bites: watch with mild soap and apply bacitracin Tetanus administered - talk to PCP about possible referral for GAD/MDD due high PHQ-9 despite being on medications.   Return if symptoms worsen or fail to improve.  [address] Santa Lighter DNP

## 2023-05-24 ENCOUNTER — Other Ambulatory Visit: Payer: Self-pay | Admitting: Nurse Practitioner

## 2023-05-24 ENCOUNTER — Telehealth: Payer: Self-pay | Admitting: Nurse Practitioner

## 2023-05-24 DIAGNOSIS — T148XXA Other injury of unspecified body region, initial encounter: Secondary | ICD-10-CM

## 2023-05-24 MED ORDER — AMOXICILLIN 875 MG PO TABS: 875.0000 mg | ORAL_TABLET | Freq: Two times a day (BID) | ORAL | 0 refills | Status: DC

## 2023-05-24 NOTE — Telephone Encounter (Signed)
Pt aware Brianna Sanchez called in antibiotic for her.

## 2023-08-14 ENCOUNTER — Other Ambulatory Visit: Payer: Self-pay | Admitting: Nurse Practitioner

## 2023-08-14 ENCOUNTER — Ambulatory Visit: Payer: Medicare HMO | Admitting: Nurse Practitioner

## 2023-08-14 DIAGNOSIS — F411 Generalized anxiety disorder: Secondary | ICD-10-CM

## 2023-08-25 ENCOUNTER — Ambulatory Visit (INDEPENDENT_AMBULATORY_CARE_PROVIDER_SITE_OTHER): Payer: Medicare HMO | Admitting: Nurse Practitioner

## 2023-08-25 ENCOUNTER — Encounter: Payer: Self-pay | Admitting: Nurse Practitioner

## 2023-08-25 VITALS — BP 108/77 | HR 68 | Temp 97.5°F | Resp 20 | Ht 66.0 in | Wt 188.0 lb

## 2023-08-25 DIAGNOSIS — E782 Mixed hyperlipidemia: Secondary | ICD-10-CM | POA: Diagnosis not present

## 2023-08-25 DIAGNOSIS — Z6832 Body mass index (BMI) 32.0-32.9, adult: Secondary | ICD-10-CM

## 2023-08-25 DIAGNOSIS — F3341 Major depressive disorder, recurrent, in partial remission: Secondary | ICD-10-CM

## 2023-08-25 DIAGNOSIS — F332 Major depressive disorder, recurrent severe without psychotic features: Secondary | ICD-10-CM

## 2023-08-25 DIAGNOSIS — F411 Generalized anxiety disorder: Secondary | ICD-10-CM | POA: Diagnosis not present

## 2023-08-25 MED ORDER — ALPRAZOLAM 1 MG PO TABS: 1.0000 mg | ORAL_TABLET | Freq: Two times a day (BID) | ORAL | 5 refills | Status: DC

## 2023-08-25 MED ORDER — VENLAFAXINE HCL ER 150 MG PO CP24: ORAL_CAPSULE | ORAL | 1 refills | Status: DC

## 2023-08-25 MED ORDER — ATORVASTATIN CALCIUM 40 MG PO TABS: 40.0000 mg | ORAL_TABLET | Freq: Every day | ORAL | 1 refills | Status: DC

## 2023-08-25 NOTE — Patient Instructions (Signed)

## 2023-08-25 NOTE — Progress Notes (Signed)
Subjective:    Patient ID: Brianna Sanchez, female    DOB: 1966/08/03, 57 y.o.   MRN: [id number]   Chief Complaint: Medical Management of Chronic Issues    HPI:  Brianna Sanchez is a 57 y.o. who identifies as a female who was assigned female at birth.   Social history: Lives with: husband Work history: disability   Comes in today for follow up of the following chronic medical issues:  1. Mixed hyperlipidemia Does try to watch diet but doe snot do any exercise. Lab Results  Component Value Date   CHOL 141 02/13/2023   HDL 48 02/13/2023   LDLCALC 74 02/13/2023   TRIG 101 02/13/2023   CHOLHDL 2.9 02/13/2023     2. GAD (generalized anxiety disorder) Is on xanax bid. Worries about everything    08/25/2023   10:50 AM 05/23/2023    9:38 AM 02/13/2023    9:43 AM 08/01/2022    8:13 AM  GAD 7 : Generalized Anxiety Score  Nervous, Anxious, on Edge 1 2 2 2   Control/stop worrying 2 3 2 3   Worry too much - different things 2 2 2 3   Trouble relaxing 1 2 1 2   Restless 1 1 1 1   Easily annoyed or irritable 2 2 1 2   Afraid - awful might happen 2 3 3 3   Total GAD 7 Score 11 15 12 16   Anxiety Difficulty Somewhat difficult Somewhat difficult Somewhat difficult Very difficult       3. Severe episode of recurrent major depressive disorder, without psychotic features (HCC) Has been on effexor and is doing well.    08/25/2023   10:50 AM 05/23/2023    9:37 AM 02/13/2023    9:42 AM  Depression screen PHQ 2/9  Decreased Interest 1 2 1   Down, Depressed, Hopeless 1 2 1   PHQ - 2 Score 2 4 2   Altered sleeping 3 3 2   Tired, decreased energy 1 1 2   Change in appetite 2 2 2   Feeling bad or failure about yourself  2 2 3   Trouble concentrating 2 2 1   Moving slowly or fidgety/restless 2 2 2   Suicidal thoughts 1 1 1   PHQ-9 Score 15 17 15   Difficult doing work/chores Somewhat difficult Somewhat difficult Somewhat difficult     4. BMI 32.0-32.9,adult No recent weight changes Wt Readings  from Last 3 Encounters:  08/25/23 188 lb (85.3 kg)  05/23/23 183 lb 12.8 oz (83.4 kg)  02/13/23 185 lb (83.9 kg)   BMI Readings from Last 3 Encounters:  08/25/23 30.34 kg/m  05/23/23 29.67 kg/m  02/13/23 29.86 kg/m      New complaints: Both eyes have been watering and itching. Feels like she need to rub them all the time.  Allergies  Allergen Reactions   Codeine    Medroxyprogesterone    Outpatient Encounter Medications as of 08/25/2023  Medication Sig   ALPRAZolam (XANAX) 1 MG tablet Take 1 tablet (1 mg total) by mouth 2 (two) times daily.   atorvastatin (LIPITOR) 40 MG tablet Take 1 tablet (40 mg total) by mouth daily.   venlafaxine XR (EFFEXOR-XR) 150 MG 24 hr capsule TAKE TWO CAPSULES ONCE DAILY WITH BREAKFAST   [DISCONTINUED] amoxicillin (AMOXIL) 875 MG tablet Take 1 tablet (875 mg total) by mouth 2 (two) times daily.   No facility-administered encounter medications on file as of 08/25/2023.    Past Surgical History:  Procedure Laterality Date   hemrrhoid      Family History  Problem Relation Age of Onset   Cancer Father        stomach   Diabetes Father    Heart attack Father    Heart disease Father    Hyperlipidemia Brother    Breast cancer Neg Hx       Controlled substance contract: n/a     Review of Systems  Constitutional:  Negative for diaphoresis.  Eyes:  Negative for pain.  Respiratory:  Negative for shortness of breath.   Cardiovascular:  Negative for chest pain, palpitations and leg swelling.  Gastrointestinal:  Negative for abdominal pain.  Endocrine: Negative for polydipsia.  Skin:  Negative for rash.  Neurological:  Negative for dizziness, weakness and headaches.  Hematological:  Does not bruise/bleed easily.  All other systems reviewed and are negative.      Objective:   Physical Exam Vitals and nursing note reviewed.  Constitutional:      General: She is not in acute distress.    Appearance: Normal appearance. She is  well-developed.  HENT:     Head: Normocephalic.     Right Ear: Tympanic membrane normal.     Left Ear: Tympanic membrane normal.     Nose: Nose normal.     Mouth/Throat:     Mouth: Mucous membranes are moist.  Eyes:     Pupils: Pupils are equal, round, and reactive to light.  Neck:     Vascular: No carotid bruit or JVD.  Cardiovascular:     Rate and Rhythm: Normal rate and regular rhythm.     Heart sounds: Normal heart sounds.  Pulmonary:     Effort: Pulmonary effort is normal. No respiratory distress.     Breath sounds: Normal breath sounds. No wheezing or rales.  Chest:     Chest wall: No tenderness.  Abdominal:     General: Bowel sounds are normal. There is no distension or abdominal bruit.     Palpations: Abdomen is soft. There is no hepatomegaly, splenomegaly, mass or pulsatile mass.     Tenderness: There is no abdominal tenderness.  Musculoskeletal:        General: Normal range of motion.     Cervical back: Normal range of motion and neck supple.  Lymphadenopathy:     Cervical: No cervical adenopathy.  Skin:    General: Skin is warm and dry.  Neurological:     Mental Status: She is alert and oriented to person, place, and time.     Deep Tendon Reflexes: Reflexes are normal and symmetric.  Psychiatric:        Behavior: Behavior normal.        Thought Content: Thought content normal.        Judgment: Judgment normal.    BP 108/77   Pulse 68   Temp (!) 97.5 F (36.4 C) (Temporal)   Resp 20   Ht 5\' 6"  (1.676 m)   Wt 188 lb (85.3 kg)   SpO2 99%   BMI 30.34 kg/m         Assessment & Plan:  LENETTA GUNNOE comes in today with chief complaint of Medical Management of Chronic Issues   Diagnosis and orders addressed:  1. Mixed hyperlipidemia Low fat diet - atorvastatin (LIPITOR) 40 MG tablet; Take 1 tablet (40 mg total) by mouth daily.  Dispense: 90 tablet; Refill: 1 - CBC with Differential/Platelet - CMP14+EGFR - Lipid panel  2. GAD (generalized  anxiety disorder) - ALPRAZolam (XANAX) 1 MG tablet; Take 1 tablet (1 mg total)  by mouth 2 (two) times daily.  Dispense: 60 tablet; Refill: 5  3. Severe episode of recurrent major depressive disorder, without psychotic features (HCC) Stress management - venlafaxine XR (EFFEXOR-XR) 150 MG 24 hr capsule; TAKE TWO CAPSULES ONCE DAILY WITH BREAKFAST  Dispense: 180 capsule; Refill: 1nt   4. BMI 32.0-32.9,adult Discussed diet and exercise for person with BMI >25 Will recheck weight in 3-6 months     Labs pending Health Maintenance reviewed Diet and exercise encouraged  Follow up plan: 6 months   Mary-Margaret Daphine Deutscher, FNP

## 2023-08-26 LAB — CMP14+EGFR
ALT: 22 IU/L (ref 0–32)
AST: 21 IU/L (ref 0–40)
Albumin: 4.3 g/dL (ref 3.8–4.9)
Alkaline Phosphatase: 109 IU/L (ref 44–121)
BUN/Creatinine Ratio: 18 (ref 9–23)
BUN: 12 mg/dL (ref 6–24)
Bilirubin Total: 0.3 mg/dL (ref 0.0–1.2)
CO2: 26 mmol/L (ref 20–29)
Calcium: 9.8 mg/dL (ref 8.7–10.2)
Chloride: 103 mmol/L (ref 96–106)
Creatinine, Ser: 0.66 mg/dL (ref 0.57–1.00)
Globulin, Total: 2.5 g/dL (ref 1.5–4.5)
Glucose: 99 mg/dL (ref 70–99)
Potassium: 5.2 mmol/L (ref 3.5–5.2)
Sodium: 141 mmol/L (ref 134–144)
Total Protein: 6.8 g/dL (ref 6.0–8.5)
eGFR: 102 mL/min/{1.73_m2} (ref >59)

## 2023-08-26 LAB — CBC WITH DIFFERENTIAL/PLATELET
Basophils Absolute: 0 10*3/uL (ref 0.0–0.2)
Basos: 1 %
EOS (ABSOLUTE): 0.1 10*3/uL (ref 0.0–0.4)
Eos: 1 %
Hematocrit: 41.1 % (ref 34.0–46.6)
Hemoglobin: 13.4 g/dL (ref 11.1–15.9)
Immature Grans (Abs): 0 10*3/uL (ref 0.0–0.1)
Immature Granulocytes: 0 %
Lymphocytes Absolute: 1.9 10*3/uL (ref 0.7–3.1)
Lymphs: 36 %
MCH: 29.5 pg (ref 26.6–33.0)
MCHC: 32.6 g/dL (ref 31.5–35.7)
MCV: 90 fL (ref 79–97)
Monocytes Absolute: 0.4 10*3/uL (ref 0.1–0.9)
Monocytes: 7 %
Neutrophils Absolute: 3 10*3/uL (ref 1.4–7.0)
Neutrophils: 55 %
Platelets: 368 10*3/uL (ref 150–450)
RBC: 4.55 x10E6/uL (ref 3.77–5.28)
RDW: 12.8 % (ref 11.7–15.4)
WBC: 5.4 10*3/uL (ref 3.4–10.8)

## 2023-08-26 LAB — LIPID PANEL
Chol/HDL Ratio: 3.1 ratio (ref 0.0–4.4)
Cholesterol, Total: 165 mg/dL (ref 100–199)
HDL: 54 mg/dL (ref >39)
LDL Chol Calc (NIH): 93 mg/dL (ref 0–99)
Triglycerides: 100 mg/dL (ref 0–149)
VLDL Cholesterol Cal: 18 mg/dL (ref 5–40)

## 2023-08-28 ENCOUNTER — Telehealth: Payer: Self-pay | Admitting: Nurse Practitioner

## 2023-08-28 ENCOUNTER — Other Ambulatory Visit: Payer: Self-pay | Admitting: Nurse Practitioner

## 2023-08-28 MED ORDER — OLOPATADINE HCL 0.2 % OP SOLN: 1.0000 [drp] | Freq: Every day | OPHTHALMIC | 1 refills | Status: DC

## 2023-08-28 NOTE — Telephone Encounter (Signed)
I forgot and thought about it  over the weekend- will send in prescription

## 2023-08-28 NOTE — Telephone Encounter (Signed)
Pt says she had a visit with MMM on Friday and said MMM was going to send her in a Rx for allergy eye drops and pharmacy is still waiting to get that Rx to fill for pt.

## 2023-08-28 NOTE — Telephone Encounter (Signed)
Patient notified and verbalized understanding. 

## 2023-11-13 ENCOUNTER — Telehealth: Payer: Self-pay | Admitting: *Deleted

## 2023-11-13 NOTE — Telephone Encounter (Unsigned)
Copied from CRM [id number]. Topic: Clinical - Request for Lab/Test Order >> Nov 13, 2023  2:12 PM Almira Coaster wrote: Reason for CRM: Patient received a reminder to schedule her annual mammography for January 2025. We need the order to be placed.

## 2023-11-13 NOTE — Telephone Encounter (Signed)
Can patient be contacted and a mammogram appt be made for the bus please?

## 2023-11-14 NOTE — Telephone Encounter (Signed)
Apt scheduled.  

## 2023-12-01 ENCOUNTER — Ambulatory Visit (INDEPENDENT_AMBULATORY_CARE_PROVIDER_SITE_OTHER): Payer: Medicare HMO

## 2023-12-01 VITALS — Ht 66.0 in | Wt 188.0 lb

## 2023-12-01 DIAGNOSIS — Z0000 Encounter for general adult medical examination without abnormal findings: Secondary | ICD-10-CM

## 2023-12-01 NOTE — Progress Notes (Signed)
Subjective:   Brianna Sanchez is a 57 y.o. female who presents for Medicare Annual (Subsequent) preventive examination.  Visit Complete: Virtual I connected with  Brianna Sanchez on 12/01/23 by a audio enabled telemedicine application and verified that I am speaking with the correct person using two identifiers.  Patient Location: Home  Provider Location: Home Office  I discussed the limitations of evaluation and management by telemedicine. The patient expressed understanding and agreed to proceed.  Vital Signs: Because this visit was a virtual/telehealth visit, some criteria may be missing or patient reported. Any vitals not documented were not able to be obtained and vitals that have been documented are patient reported.  Cardiac Risk Factors include: advanced age (>73men, >65 women);dyslipidemia;smoking/ tobacco exposure     Objective:    Today's Vitals   12/01/23 0921  Weight: 188 lb (85.3 kg)  Height: 5\' 6"  (1.676 m)   Body mass index is 30.34 kg/m.     12/01/2023    9:31 AM 11/29/2022    8:31 AM 10/27/2021    8:23 AM 08/21/2019   10:58 AM 12/17/2015    2:08 PM  Advanced Directives  Does Patient Have a Medical Advance Directive? No No No No No  Would patient like information on creating a medical advance directive? Yes (MAU/Ambulatory/Procedural Areas - Information given) No - Patient declined No - Patient declined No - Patient declined No - patient declined information    Current Medications (verified) Outpatient Encounter Medications as of 12/01/2023  Medication Sig   ALPRAZolam (XANAX) 1 MG tablet Take 1 tablet (1 mg total) by mouth 2 (two) times daily.   atorvastatin (LIPITOR) 40 MG tablet Take 1 tablet (40 mg total) by mouth daily.   Olopatadine HCl (PATADAY) 0.2 % SOLN Apply 1 drop to eye daily.   venlafaxine XR (EFFEXOR-XR) 150 MG 24 hr capsule TAKE TWO CAPSULES ONCE DAILY WITH BREAKFAST   No facility-administered encounter medications on file as of  12/01/2023.    Allergies (verified) Codeine and Medroxyprogesterone   History: Past Medical History:  Diagnosis Date   Depression    GAD (generalized anxiety disorder)    Hyperlipidemia    Past Surgical History:  Procedure Laterality Date   hemrrhoid     Family History  Problem Relation Age of Onset   Cancer Father        stomach   Diabetes Father    Heart attack Father    Heart disease Father    Hyperlipidemia Brother    Breast cancer Neg Hx    Social History   Socioeconomic History   Marital status: Married    Spouse name: Micheal   Number of children: 2   Years of education: Not on file   Highest education level: GED or equivalent  Occupational History   Occupation: disable  Tobacco Use   Smoking status: Every Day    Current packs/day: 1.00    Average packs/day: 1 pack/day for 20.0 years (20.0 ttl pk-yrs)    Types: E-cigarettes, Cigarettes   Smokeless tobacco: Never  Vaping Use   Vaping status: Former  Substance and Sexual Activity   Alcohol use: No   Drug use: No   Sexual activity: Not on file  Other Topics Concern   Not on file  Social History Narrative   1 daughter and 1 son   2 grandchildren   Social Drivers of Corporate investment banker Strain: Low Risk  (12/01/2023)   Overall Financial Resource Strain (CARDIA)  Difficulty of Paying Living Expenses: Not hard at all  Food Insecurity: No Food Insecurity (12/01/2023)   Hunger Vital Sign    Worried About Running Out of Food in the Last Year: Never true    Ran Out of Food in the Last Year: Never true  Transportation Needs: No Transportation Needs (12/01/2023)   PRAPARE - Administrator, Civil Service (Medical): No    Lack of Transportation (Non-Medical): No  Physical Activity: Inactive (12/01/2023)   Exercise Vital Sign    Days of Exercise per Week: 0 days    Minutes of Exercise per Session: 0 min  Stress: No Stress Concern Present (12/01/2023)   [employer] of  Occupational Health - Occupational Stress Questionnaire    Feeling of Stress : Not at all  Social Connections: Moderately Isolated (12/01/2023)   Social Connection and Isolation Panel [NHANES]    Frequency of Communication with Friends and Family: More than three times a week    Frequency of Social Gatherings with Friends and Family: Three times a week    Attends Religious Services: Never    Active Member of Clubs or Organizations: No    Attends Engineer, structural: Never    Marital Status: Married    Tobacco Counseling Ready to quit: No Counseling given: Not Answered   Clinical Intake:  Pre-visit preparation completed: Yes  Pain : No/denies pain     Diabetes: No  How often do you need to have someone help you when you read instructions, pamphlets, or other written materials from your doctor or pharmacy?: 1 - Never  Interpreter Needed?: No  Information entered by :: Kandis Fantasia LPN   Activities of Daily Living    12/01/2023    9:30 AM  In your present state of health, do you have any difficulty performing the following activities:  Hearing? 0  Vision? 0  Difficulty concentrating or making decisions? 0  Walking or climbing stairs? 0  Dressing or bathing? 0  Doing errands, shopping? 0  Preparing Food and eating ? N  Using the Toilet? N  In the past six months, have you accidently leaked urine? N  Do you have problems with loss of bowel control? N  Managing your Medications? N  Managing your Finances? N  Housekeeping or managing your Housekeeping? N    Patient Care Team: Bennie Pierini, FNP as PCP - General (Nurse Practitioner)  Indicate any recent Medical Services you may have received from other than [facility] providers in the past year (date may be approximate).     Assessment:   This is a routine wellness examination for Brianna Sanchez.  Hearing/Vision screen Hearing Screening - Comments:: Denies hearing difficulties   Vision Screening -  Comments:: No vision problems; will schedule routine eye exam soon     Goals Addressed             This Visit's Progress    COMPLETED: Patient Stated       08/21/2019 AWV Goal: Exercise for General Health  Patient will verbalize understanding of the benefits of increased physical activity: Exercising regularly is important. It will improve your overall fitness, flexibility, and endurance. Regular exercise also will improve your overall health. It can help you control your weight, reduce stress, and improve your bone density. Over the next year, patient will increase physical activity as tolerated with a goal of at least 150 minutes of moderate physical activity per week.  You can tell that you are exercising at a moderate  intensity if your heart starts beating faster and you start breathing faster but can still hold a conversation. Moderate-intensity exercise ideas include: Walking 1 mile (1.6 km) in about 15 minutes Biking Hiking Golfing Dancing Water aerobics Patient will verbalize understanding of everyday activities that increase physical activity by providing examples like the following: Yard work, such as: Insurance underwriter Gardening Washing windows or floors Patient will be able to explain general safety guidelines for exercising:  Before you start a new exercise program, talk with your health care provider. Do not exercise so much that you hurt yourself, feel dizzy, or get very short of breath. Wear comfortable clothes and wear shoes with good support. Drink plenty of water while you exercise to prevent dehydration or heat stroke. Work out until your breathing and your heartbeat get faster.  08/21/2019 AWV Goal: Tobacco Cessation  Smoking cessation instruction/counseling given:  counseled patient on the dangers of tobacco use, advised patient to stop smoking, and reviewed strategies to  maximize success  Patient will verbalize understanding of the health risks associated with smoking/tobacco use Lung cancer or lung disease, such as COPD Heart disease. Stroke. Heart attack Infertility Osteoporosis and bone fractures. Patient will create a plan to quit smoking/using tobacco Pick a date to quit.  Write down the reasons why you are quitting and put it where you will see it often. Identify the people, places, things, and activities that make you want to smoke (triggers) and avoid them. Make sure to take these actions: Throw away all cigarettes at home, at work, and in your car. Throw away smoking accessories, such as Set designer. Clean your car and make sure to empty the ashtray. Clean your home, including curtains and carpets. Tell your family, friends, and coworkers that you are quitting. Support from your loved ones can make quitting easier. Talk with your health care provider about your options for quitting smoking. Find out what treatment options are covered by your health insurance. Patient will be able to demonstrate knowledge of tobacco cessation strategies that may maximize success Quitting "cold [place]" is more successful than gradually quitting. Attending in-person counseling to help you build problem-solving skills.  Finding resources and support systems that can help you to quit smoking and remain smoke-free after you quit. These resources are most helpful when you use them often. They can include: Online chats with a Veterinary surgeon. Telephone quitlines. Printed Materials engineer. Support groups or group counseling. Text messaging programs. Mobile phone applications. Taking medicines to help you quit smoking: Nicotine patches, gum, or lozenges. Nicotine inhalers or sprays. Non-nicotine medicine that is taken by mouth. Patient will note get discouraged if the process is difficult Over the next year, patient will stop smoking or using other forms of  tobacco  Smoking cessation instruction/counseling given:  counseled patient on the dangers of tobacco use, advised patient to stop smoking, and reviewed strategies to maximize success       Remain active and independent        Depression Screen    12/01/2023    9:28 AM 08/25/2023   10:50 AM 05/23/2023    9:37 AM 02/13/2023    9:42 AM 11/29/2022    8:29 AM 08/01/2022    8:13 AM 01/31/2022    8:19 AM  PHQ 2/9 Scores  PHQ - 2 Score 2 2 4 2  0 4 3  PHQ- 9 Score 12 15 17 15  17  15  Fall Risk    12/01/2023    9:30 AM 08/25/2023   10:50 AM 02/13/2023    9:42 AM 11/29/2022    8:28 AM 08/01/2022    8:13 AM  Fall Risk   Falls in the past year? 0 0 0 0 1  Number falls in past yr: 0   0 0  Injury with Fall? 0   0 0  Risk for fall due to : No Fall Risks   No Fall Risks History of fall(s)  Follow up Falls prevention discussed;Education provided;Falls evaluation completed   Falls prevention discussed Education provided    MEDICARE RISK AT HOME: Medicare Risk at Home Any stairs in or around the home?: No If so, are there any without handrails?: No Home free of loose throw rugs in walkways, pet beds, electrical cords, etc?: Yes Adequate lighting in your home to reduce risk of falls?: Yes Life alert?: No Use of a cane, walker or w/c?: No Grab bars in the bathroom?: Yes Shower chair or bench in shower?: No Elevated toilet seat or a handicapped toilet?: Yes  TIMED UP AND GO:  Was the test performed?  No    Cognitive Function:    12/17/2015    2:11 PM  MMSE - Mini Mental State Exam  Orientation to time 5  Orientation to Place 5  Registration 3  Attention/ Calculation 2  Recall 2  Language- name 2 objects 2  Language- repeat 1  Language- follow 3 step command 3  Language- read & follow direction 1  Write a sentence 1  Copy design 1  Total score 26        12/01/2023    9:31 AM 11/29/2022    8:32 AM 10/27/2021    8:28 AM 08/21/2019   11:01 AM  6CIT Screen  What Year? 0  points 0 points 0 points 0 points  What month? 0 points 0 points 0 points 0 points  What time? 0 points 0 points 0 points 0 points  Count back from 20 0 points 0 points 0 points 0 points  Months in reverse 0 points 0 points 0 points 0 points  Repeat phrase 0 points 0 points 4 points 6 points  Total Score 0 points 0 points 4 points 6 points    Immunizations Immunization History  Administered Date(s) Administered   Influenza,inj,Quad PF,6+ Mos 12/17/2015, 12/21/2016, 11/10/2017, 10/26/2018, 01/29/2021   Influenza-Unspecified 10/24/2017, 10/24/2018   Td 04/16/1990   Tdap 05/23/2023    TDAP status: Up to date  Flu Vaccine status: Up to date  Pneumococcal vaccine status: Up to date  Covid-19 vaccine status: Information provided on how to obtain vaccines.   Qualifies for Shingles Vaccine? Yes   Zostavax completed No   Shingrix Completed?: No.    Education has been provided regarding the importance of this vaccine. Patient has been advised to call insurance company to determine out of pocket expense if they have not yet received this vaccine. Advised may also receive vaccine at local pharmacy or Health Dept. Verbalized acceptance and understanding.  Screening Tests Health Maintenance  Topic Date Due   Lung Cancer Screening  Never done   Zoster Vaccines- Shingrix (1 of 2) Never done   COVID-19 Vaccine (1 - 2024-25 season) Never done   Cervical Cancer Screening (HPV/Pap Cotest)  01/30/2024   INFLUENZA VACCINE  03/18/2024 (Originally 07/20/2023)   MAMMOGRAM  12/27/2023   Medicare Annual Wellness (AWV)  11/30/2024   DEXA SCAN  02/20/2025   Colonoscopy  07/06/2025   DTaP/Tdap/Td (3 - Td or Tdap) 05/22/2033   Hepatitis C Screening  Completed   HIV Screening  Completed   HPV VACCINES  Aged Out    Health Maintenance  Health Maintenance Due  Topic Date Due   Lung Cancer Screening  Never done   Zoster Vaccines- Shingrix (1 of 2) Never done   COVID-19 Vaccine (1 - 2024-25 season)  Never done   Cervical Cancer Screening (HPV/Pap Cotest)  01/30/2024    Colorectal cancer screening: Type of screening: Colonoscopy. Completed 07/07/15. Repeat every 10 years  Mammogram status: Completed 01/05/23. Repeat every year  Bone Density status: Completed 02/21/23. Results reflect: Bone density results: OSTEOPENIA. Repeat every 2 years.  Lung Cancer Screening: (Low Dose CT Chest recommended if Age 38-80 years, 20 pack-year currently smoking OR have quit w/in 15years.) does not qualify.   Lung Cancer Screening Referral: n/a  Additional Screening:  Hepatitis C Screening: does qualify; Completed 03/03/16  Vision Screening: Recommended annual ophthalmology exams for early detection of glaucoma and other disorders of the eye. Is the patient up to date with their annual eye exam?  No  Who is the provider or what is the name of the office in which the patient attends annual eye exams? none If pt is not established with a provider, would they like to be referred to a provider to establish care? No .   Dental Screening: Recommended annual dental exams for proper oral hygiene  Community Resource Referral / Chronic Care Management: CRR required this visit?  No   CCM required this visit?  No     Plan:     I have personally reviewed and noted the following in the patient's chart:   Medical and social history Use of alcohol, tobacco or illicit drugs  Current medications and supplements including opioid prescriptions. Patient is not currently taking opioid prescriptions. Functional ability and status Nutritional status Physical activity Advanced directives List of other physicians Hospitalizations, surgeries, and ER visits in previous 12 months Vitals Screenings to include cognitive, depression, and falls Referrals and appointments  In addition, I have reviewed and discussed with patient certain preventive protocols, quality metrics, and best practice recommendations. A written  personalized care plan for preventive services as well as general preventive health recommendations were provided to patient.     Kandis Fantasia [place], [place]   [id number]   After Visit Summary: (MyChart) Due to this being a telephonic visit, the after visit summary with patients personalized plan was offered to patient via MyChart   Nurse Notes: No concerns at this time

## 2023-12-01 NOTE — Patient Instructions (Signed)
Brianna Sanchez , Thank you for taking time to come for your Medicare Wellness Visit. I appreciate your ongoing commitment to your health goals. Please review the following plan we discussed and let me know if I can assist you in the future.   Referrals/Orders/Follow-Ups/Clinician Recommendations: Aim for 30 minutes of exercise or brisk walking, 6-8 glasses of water, and 5 servings of fruits and vegetables each day.  This is a list of the screening recommended for you and due dates:  Health Maintenance  Topic Date Due   Screening for Lung Cancer  Never done   Zoster (Shingles) Vaccine (1 of 2) Never done   COVID-19 Vaccine (1 - 2024-25 season) Never done   Pap with HPV screening  01/30/2024   Flu Shot  03/18/2024*   Mammogram  12/27/2023   Medicare Annual Wellness Visit  11/30/2024   DEXA scan (bone density measurement)  02/20/2025   Colon Cancer Screening  07/06/2025   DTaP/Tdap/Td vaccine (3 - Td or Tdap) 05/22/2033   Hepatitis C Screening  Completed   HIV Screening  Completed   HPV Vaccine  Aged Out  *Topic was postponed. The date shown is not the original due date.    Advanced directives: (ACP Link)Information on Advanced Care Planning can be found at [facility] Advance Health Care Directives Advance Health Care Directives ([email or web address]   Next Medicare Annual Wellness Visit scheduled for next year: Yes

## 2023-12-13 ENCOUNTER — Other Ambulatory Visit: Payer: Self-pay | Admitting: Nurse Practitioner

## 2024-01-05 ENCOUNTER — Other Ambulatory Visit: Payer: Self-pay | Admitting: Nurse Practitioner

## 2024-01-05 DIAGNOSIS — Z1231 Encounter for screening mammogram for malignant neoplasm of breast: Secondary | ICD-10-CM

## 2024-01-10 ENCOUNTER — Inpatient Hospital Stay: Admission: RE | Admit: 2024-01-10 | Payer: Medicare HMO | Source: Ambulatory Visit

## 2024-02-11 ENCOUNTER — Other Ambulatory Visit: Payer: Self-pay | Admitting: Nurse Practitioner

## 2024-02-11 DIAGNOSIS — F3341 Major depressive disorder, recurrent, in partial remission: Secondary | ICD-10-CM

## 2024-02-11 DIAGNOSIS — E782 Mixed hyperlipidemia: Secondary | ICD-10-CM

## 2024-02-14 ENCOUNTER — Ambulatory Visit
Admission: RE | Admit: 2024-02-14 | Discharge: 2024-02-14 | Disposition: A | Discharge status: Home / Self Care | Payer: Medicare HMO | Source: Ambulatory Visit | Attending: Nurse Practitioner | Admitting: Nurse Practitioner

## 2024-02-14 DIAGNOSIS — Z1231 Encounter for screening mammogram for malignant neoplasm of breast: Secondary | ICD-10-CM

## 2024-02-19 ENCOUNTER — Other Ambulatory Visit: Payer: Self-pay | Admitting: Nurse Practitioner

## 2024-02-19 DIAGNOSIS — F411 Generalized anxiety disorder: Secondary | ICD-10-CM

## 2024-02-20 ENCOUNTER — Encounter: Payer: Self-pay | Admitting: Nurse Practitioner

## 2024-02-20 ENCOUNTER — Ambulatory Visit (INDEPENDENT_AMBULATORY_CARE_PROVIDER_SITE_OTHER): Payer: Medicare Other | Admitting: Nurse Practitioner

## 2024-02-20 VITALS — BP 117/78 | HR 75 | Temp 97.1°F | Ht 66.0 in | Wt 195.0 lb

## 2024-02-20 DIAGNOSIS — F3341 Major depressive disorder, recurrent, in partial remission: Secondary | ICD-10-CM

## 2024-02-20 DIAGNOSIS — E782 Mixed hyperlipidemia: Secondary | ICD-10-CM

## 2024-02-20 DIAGNOSIS — Z6832 Body mass index (BMI) 32.0-32.9, adult: Secondary | ICD-10-CM | POA: Diagnosis not present

## 2024-02-20 DIAGNOSIS — F332 Major depressive disorder, recurrent severe without psychotic features: Secondary | ICD-10-CM | POA: Diagnosis not present

## 2024-02-20 DIAGNOSIS — Z23 Encounter for immunization: Secondary | ICD-10-CM | POA: Diagnosis not present

## 2024-02-20 DIAGNOSIS — F411 Generalized anxiety disorder: Secondary | ICD-10-CM

## 2024-02-20 MED ORDER — ATORVASTATIN CALCIUM 40 MG PO TABS: 40.0000 mg | ORAL_TABLET | Freq: Every day | ORAL | 1 refills | Status: DC

## 2024-02-20 MED ORDER — VENLAFAXINE HCL ER 150 MG PO CP24: ORAL_CAPSULE | ORAL | 1 refills | Status: DC

## 2024-02-20 MED ORDER — ALPRAZOLAM 1 MG PO TABS: 1.0000 mg | ORAL_TABLET | Freq: Two times a day (BID) | ORAL | 5 refills | Status: DC

## 2024-02-20 NOTE — Progress Notes (Signed)
 Subjective:    Patient ID: Brianna Sanchez, female    DOB: 16-Mar-1966, 58 y.o.   MRN: [id number]   Chief Complaint: medical management of chronic issues     HPI:  Brianna Sanchez is a 58 y.o. who identifies as a female who was assigned female at birth.   Social history: Lives with: husband Work history: disability   Comes in today for follow up of the following chronic medical issues:  1. Mixed hyperlipidemia Does try to watch diet but doe snot do any exercise. Lab Results  Component Value Date   CHOL 165 08/25/2023   HDL 54 08/25/2023   LDLCALC 93 08/25/2023   TRIG 100 08/25/2023   CHOLHDL 3.1 08/25/2023     2. GAD (generalized anxiety disorder) Is on xanax bid. Worries about everything    02/20/2024   11:23 AM 08/25/2023   10:50 AM 05/23/2023    9:38 AM 02/13/2023    9:43 AM  GAD 7 : Generalized Anxiety Score  Nervous, Anxious, on Edge 1 1 2 2   Control/stop worrying 3 2 3 2   Worry too much - different things 2 2 2 2   Trouble relaxing 1 1 2 1   Restless 1 1 1 1   Easily annoyed or irritable 2 2 2 1   Afraid - awful might happen 1 2 3 3   Total GAD 7 Score 11 11 15 12   Anxiety Difficulty Somewhat difficult Somewhat difficult Somewhat difficult Somewhat difficult         3. Severe episode of recurrent major depressive disorder, without psychotic features (HCC) Has been on effexor and is doing well.    02/20/2024   11:22 AM 12/01/2023    9:28 AM 08/25/2023   10:50 AM  Depression screen PHQ 2/9  Decreased Interest 1 1 1   Down, Depressed, Hopeless 1 1 1   PHQ - 2 Score 2 2 2   Altered sleeping 2 2 3   Tired, decreased energy 2 1 1   Change in appetite 2 2 2   Feeling bad or failure about yourself  3 2 2   Trouble concentrating 1 2 2   Moving slowly or fidgety/restless 1  2  Suicidal thoughts 1 1 1   PHQ-9 Score 14 12 15   Difficult doing work/chores Somewhat difficult  Somewhat difficult      4. BMI 32.0-32.9,adult Weight is up 7 lbs Wt Readings from Last 3  Encounters:  02/20/24 195 lb (88.5 kg)  12/01/23 188 lb (85.3 kg)  08/25/23 188 lb (85.3 kg)   BMI Readings from Last 3 Encounters:  02/20/24 31.47 kg/m  12/01/23 30.34 kg/m  08/25/23 30.34 kg/m        New complaints: None today  Allergies  Allergen Reactions   Codeine    Medroxyprogesterone    Outpatient Encounter Medications as of 02/20/2024  Medication Sig   ALPRAZolam (XANAX) 1 MG tablet Take 1 tablet (1 mg total) by mouth 2 (two) times daily.   atorvastatin (LIPITOR) 40 MG tablet TAKE ONE TABLET BY MOUTH DAILY   Olopatadine HCl 0.2 % SOLN APPLY ONE DROP TO EYE DAILY.   venlafaxine XR (EFFEXOR-XR) 150 MG 24 hr capsule TAKE TWO CAPSULES BY MOUTH WITH BREAKFAST   No facility-administered encounter medications on file as of 02/20/2024.    Past Surgical History:  Procedure Laterality Date   hemrrhoid      Family History  Problem Relation Age of Onset   Cancer Father        stomach   Diabetes Father  Heart attack Father    Heart disease Father    Hyperlipidemia Brother    Breast cancer Neg Hx       Controlled substance contract: n/a     Review of Systems  Constitutional:  Negative for diaphoresis.  Eyes:  Negative for pain.  Respiratory:  Negative for shortness of breath.   Cardiovascular:  Negative for chest pain, palpitations and leg swelling.  Gastrointestinal:  Negative for abdominal pain.  Endocrine: Negative for polydipsia.  Skin:  Negative for rash.  Neurological:  Negative for dizziness, weakness and headaches.  Hematological:  Does not bruise/bleed easily.  All other systems reviewed and are negative.      Objective:   Physical Exam Vitals and nursing note reviewed.  Constitutional:      General: She is not in acute distress.    Appearance: Normal appearance. She is well-developed.  HENT:     Head: Normocephalic.     Right Ear: Tympanic membrane normal.     Left Ear: Tympanic membrane normal.     Nose: Nose normal.      Mouth/Throat:     Mouth: Mucous membranes are moist.  Eyes:     Pupils: Pupils are equal, round, and reactive to light.  Neck:     Vascular: No carotid bruit or JVD.  Cardiovascular:     Rate and Rhythm: Normal rate and regular rhythm.     Heart sounds: Normal heart sounds.  Pulmonary:     Effort: Pulmonary effort is normal. No respiratory distress.     Breath sounds: Normal breath sounds. No wheezing or rales.  Chest:     Chest wall: No tenderness.  Abdominal:     General: Bowel sounds are normal. There is no distension or abdominal bruit.     Palpations: Abdomen is soft. There is no hepatomegaly, splenomegaly, mass or pulsatile mass.     Tenderness: There is no abdominal tenderness.  Musculoskeletal:        General: Normal range of motion.     Cervical back: Normal range of motion and neck supple.  Lymphadenopathy:     Cervical: No cervical adenopathy.  Skin:    General: Skin is warm and dry.  Neurological:     Mental Status: She is alert and oriented to person, place, and time.     Deep Tendon Reflexes: Reflexes are normal and symmetric.  Psychiatric:        Behavior: Behavior normal.        Thought Content: Thought content normal.        Judgment: Judgment normal.    BP 117/78   Pulse 75   Temp (!) 97.1 F (36.2 C) (Temporal)   Ht 5\' 6"  (1.676 m)   Wt 195 lb (88.5 kg)   SpO2 98%   BMI 31.47 kg/m          Assessment & Plan:  Brianna Sanchez comes in today with chief complaint of medical management of chronic issues    Diagnosis and orders addressed:  1. Mixed hyperlipidemia Low fat diet - atorvastatin (LIPITOR) 40 MG tablet; Take 1 tablet (40 mg total) by mouth daily.  Dispense: 90 tablet; Refill: 1 - CBC with Differential/Platelet - CMP14+EGFR - Lipid panel  2. GAD (generalized anxiety disorder) - ALPRAZolam (XANAX) 1 MG tablet; Take 1 tablet (1 mg total) by mouth 2 (two) times daily.  Dispense: 60 tablet; Refill: 5  3. Severe episode of recurrent  major depressive disorder, without psychotic features (HCC) Stress  management - venlafaxine XR (EFFEXOR-XR) 150 MG 24 hr capsule; TAKE TWO CAPSULES ONCE DAILY WITH BREAKFAST  Dispense: 180 capsule; Refill: 1nt   4. BMI 32.0-32.9,adult Discussed diet and exercise for person with BMI >25 Will recheck weight in 3-6 months     Labs pending Health Maintenance reviewed Diet and exercise encouraged  Follow up plan: 6 months   Mary-Margaret Daphine Deutscher, FNP

## 2024-02-20 NOTE — Addendum Note (Signed)
 Addended by: Cleda Daub on: 02/20/2024 11:45 AM   Modules accepted: Orders

## 2024-02-20 NOTE — Patient Instructions (Signed)

## 2024-02-21 LAB — CBC WITH DIFFERENTIAL/PLATELET
Basophils Absolute: 0 10*3/uL (ref 0.0–0.2)
Basos: 1 %
EOS (ABSOLUTE): 0.1 10*3/uL (ref 0.0–0.4)
Eos: 1 %
Hematocrit: 41.4 % (ref 34.0–46.6)
Hemoglobin: 13.6 g/dL (ref 11.1–15.9)
Immature Grans (Abs): 0 10*3/uL (ref 0.0–0.1)
Immature Granulocytes: 0 %
Lymphocytes Absolute: 2.5 10*3/uL (ref 0.7–3.1)
Lymphs: 43 %
MCH: 30.1 pg (ref 26.6–33.0)
MCHC: 32.9 g/dL (ref 31.5–35.7)
MCV: 92 fL (ref 79–97)
Monocytes Absolute: 0.4 10*3/uL (ref 0.1–0.9)
Monocytes: 8 %
Neutrophils Absolute: 2.8 10*3/uL (ref 1.4–7.0)
Neutrophils: 47 %
Platelets: 352 10*3/uL (ref 150–450)
RBC: 4.52 x10E6/uL (ref 3.77–5.28)
RDW: 12.7 % (ref 11.7–15.4)
WBC: 5.8 10*3/uL (ref 3.4–10.8)

## 2024-02-21 LAB — CMP14+EGFR
ALT: 29 IU/L (ref 0–32)
AST: 22 IU/L (ref 0–40)
Albumin: 4.4 g/dL (ref 3.8–4.9)
Alkaline Phosphatase: 100 IU/L (ref 44–121)
BUN/Creatinine Ratio: 19 (ref 9–23)
BUN: 13 mg/dL (ref 6–24)
Bilirubin Total: 0.3 mg/dL (ref 0.0–1.2)
CO2: 23 mmol/L (ref 20–29)
Calcium: 9.5 mg/dL (ref 8.7–10.2)
Chloride: 102 mmol/L (ref 96–106)
Creatinine, Ser: 0.67 mg/dL (ref 0.57–1.00)
Globulin, Total: 2.5 g/dL (ref 1.5–4.5)
Glucose: 81 mg/dL (ref 70–99)
Potassium: 4.1 mmol/L (ref 3.5–5.2)
Sodium: 141 mmol/L (ref 134–144)
Total Protein: 6.9 g/dL (ref 6.0–8.5)
eGFR: 101 mL/min/{1.73_m2} (ref >59)

## 2024-02-21 LAB — LIPID PANEL
Chol/HDL Ratio: 2.9 ratio (ref 0.0–4.4)
Cholesterol, Total: 144 mg/dL (ref 100–199)
HDL: 49 mg/dL (ref >39)
LDL Chol Calc (NIH): 73 mg/dL (ref 0–99)
Triglycerides: 122 mg/dL (ref 0–149)
VLDL Cholesterol Cal: 22 mg/dL (ref 5–40)

## 2024-02-22 LAB — TOXASSURE SELECT 13 (MW), URINE

## 2024-06-19 DIAGNOSIS — F17290 Nicotine dependence, other tobacco product, uncomplicated: Secondary | ICD-10-CM | POA: Diagnosis not present

## 2024-06-19 DIAGNOSIS — S139XXA Sprain of joints and ligaments of unspecified parts of neck, initial encounter: Secondary | ICD-10-CM | POA: Diagnosis not present

## 2024-06-19 DIAGNOSIS — S8001XA Contusion of right knee, initial encounter: Secondary | ICD-10-CM | POA: Diagnosis not present

## 2024-06-19 DIAGNOSIS — Y939 Activity, unspecified: Secondary | ICD-10-CM | POA: Diagnosis not present

## 2024-06-19 DIAGNOSIS — Y929 Unspecified place or not applicable: Secondary | ICD-10-CM | POA: Diagnosis not present

## 2024-06-19 DIAGNOSIS — S0990XA Unspecified injury of head, initial encounter: Secondary | ICD-10-CM | POA: Diagnosis not present

## 2024-06-19 DIAGNOSIS — Z0489 Encounter for examination and observation for other specified reasons: Secondary | ICD-10-CM | POA: Diagnosis not present

## 2024-07-05 ENCOUNTER — Encounter: Payer: Self-pay | Admitting: Advanced Practice Midwife

## 2024-08-21 ENCOUNTER — Other Ambulatory Visit: Payer: Self-pay | Admitting: Nurse Practitioner

## 2024-08-21 DIAGNOSIS — F411 Generalized anxiety disorder: Secondary | ICD-10-CM

## 2024-08-22 ENCOUNTER — Ambulatory Visit (INDEPENDENT_AMBULATORY_CARE_PROVIDER_SITE_OTHER)

## 2024-08-22 ENCOUNTER — Ambulatory Visit: Admitting: Nurse Practitioner

## 2024-08-22 VITALS — BP 105/72 | HR 64 | Temp 97.9°F | Ht 66.0 in | Wt 195.8 lb

## 2024-08-22 DIAGNOSIS — Z23 Encounter for immunization: Secondary | ICD-10-CM | POA: Diagnosis not present

## 2024-08-22 DIAGNOSIS — M25551 Pain in right hip: Secondary | ICD-10-CM

## 2024-08-22 DIAGNOSIS — F332 Major depressive disorder, recurrent severe without psychotic features: Secondary | ICD-10-CM | POA: Diagnosis not present

## 2024-08-22 DIAGNOSIS — E782 Mixed hyperlipidemia: Secondary | ICD-10-CM

## 2024-08-22 DIAGNOSIS — Z6832 Body mass index (BMI) 32.0-32.9, adult: Secondary | ICD-10-CM | POA: Diagnosis not present

## 2024-08-22 DIAGNOSIS — Z0001 Encounter for general adult medical examination with abnormal findings: Secondary | ICD-10-CM

## 2024-08-22 DIAGNOSIS — F3341 Major depressive disorder, recurrent, in partial remission: Secondary | ICD-10-CM

## 2024-08-22 DIAGNOSIS — F411 Generalized anxiety disorder: Secondary | ICD-10-CM

## 2024-08-22 DIAGNOSIS — Z0000 Encounter for general adult medical examination without abnormal findings: Secondary | ICD-10-CM

## 2024-08-22 LAB — LIPID PANEL

## 2024-08-22 MED ORDER — VENLAFAXINE HCL ER 150 MG PO CP24: ORAL_CAPSULE | ORAL | 1 refills | Status: AC

## 2024-08-22 MED ORDER — ATORVASTATIN CALCIUM 40 MG PO TABS: 40.0000 mg | ORAL_TABLET | Freq: Every day | ORAL | 1 refills | Status: AC

## 2024-08-22 MED ORDER — ALPRAZOLAM 1 MG PO TABS: 1.0000 mg | ORAL_TABLET | Freq: Two times a day (BID) | ORAL | 5 refills | Status: AC

## 2024-08-22 MED ORDER — CELECOXIB 200 MG PO CAPS: 200.0000 mg | ORAL_CAPSULE | Freq: Two times a day (BID) | ORAL | 2 refills | Status: AC

## 2024-08-22 NOTE — Progress Notes (Signed)
 Subjective:    Patient ID: Brianna Sanchez, female    DOB: 1966-05-09, 58 y.o.   MRN: [id number]   Chief Complaint: annual physical    HPI:  Brianna Sanchez is a 58 y.o. who identifies as a female who was assigned female at birth.   Social history: Lives with: husband Work history: disability   Comes in today for follow up of the following chronic medical issues:  1. Mixed hyperlipidemia Does try to watch diet but doe snot do any exercise. Lab Results  Component Value Date   CHOL 144 02/20/2024   HDL 49 02/20/2024   LDLCALC 73 02/20/2024   TRIG 122 02/20/2024   CHOLHDL 2.9 02/20/2024     2. GAD (generalized anxiety disorder) Is on xanax  bid. Worries about everything    02/20/2024   11:23 AM 08/25/2023   10:50 AM 05/23/2023    9:38 AM 02/13/2023    9:43 AM  GAD 7 : Generalized Anxiety Score  Nervous, Anxious, on Edge 1 1 2 2   Control/stop worrying 3 2 3 2   Worry too much - different things 2 2 2 2   Trouble relaxing 1 1 2 1   Restless 1 1 1 1   Easily annoyed or irritable 2 2 2 1   Afraid - awful might happen 1 2 3 3   Total GAD 7 Score 11 11 15 12   Anxiety Difficulty Somewhat difficult Somewhat difficult Somewhat difficult Somewhat difficult         3. Severe episode of recurrent major depressive disorder, without psychotic features (HCC) Has been on effexor  and is doing well.    02/20/2024   11:22 AM 12/01/2023    9:28 AM 08/25/2023   10:50 AM  Depression screen PHQ 2/9  Decreased Interest 1 1 1   Down, Depressed, Hopeless 1 1 1   PHQ - 2 Score 2 2 2   Altered sleeping 2 2 3   Tired, decreased energy 2 1 1   Change in appetite 2 2 2   Feeling bad or failure about yourself  3 2 2   Trouble concentrating 1 2 2   Moving slowly or fidgety/restless 1  2  Suicidal thoughts 1 1 1   PHQ-9 Score 14 12 15   Difficult doing work/chores Somewhat difficult  Somewhat difficult      4. BMI 32.0-32.9,adult Weight is up 7 lbs Wt Readings from Last 3 Encounters:  02/20/24  195 lb (88.5 kg)  12/01/23 188 lb (85.3 kg)  08/25/23 188 lb (85.3 kg)   BMI Readings from Last 3 Encounters:  02/20/24 31.47 kg/m  12/01/23 30.34 kg/m  08/25/23 30.34 kg/m        New complaints: Right hip pain for several months. Pain is achy and worse when laying on it. Has not taken anything for it. Rates pain 5/10.   Allergies  Allergen Reactions   Codeine    Medroxyprogesterone    Outpatient Encounter Medications as of 08/22/2024  Medication Sig   ALPRAZolam  (XANAX ) 1 MG tablet Take 1 tablet (1 mg total) by mouth 2 (two) times daily.   atorvastatin  (LIPITOR) 40 MG tablet Take 1 tablet (40 mg total) by mouth daily.   Olopatadine  HCl 0.2 % SOLN APPLY ONE DROP TO EYE DAILY.   venlafaxine  XR (EFFEXOR -XR) 150 MG 24 hr capsule TAKE TWO CAPSULES BY MOUTH WITH BREAKFAST   No facility-administered encounter medications on file as of 08/22/2024.    Past Surgical History:  Procedure Laterality Date   hemrrhoid      Family History  Problem Relation Age of Onset   Cancer Father        stomach   Diabetes Father    Heart attack Father    Heart disease Father    Hyperlipidemia Brother    Breast cancer Neg Hx       Controlled substance contract: n/a     Review of Systems  Constitutional:  Negative for diaphoresis.  Eyes:  Negative for pain.  Respiratory:  Negative for shortness of breath.   Cardiovascular:  Negative for chest pain, palpitations and leg swelling.  Gastrointestinal:  Negative for abdominal pain.  Endocrine: Negative for polydipsia.  Skin:  Negative for rash.  Neurological:  Negative for dizziness, weakness and headaches.  Hematological:  Does not bruise/bleed easily.  All other systems reviewed and are negative.      Objective:   Physical Exam Vitals and nursing note reviewed.  Constitutional:      General: She is not in acute distress.    Appearance: Normal appearance. She is well-developed.  HENT:     Head: Normocephalic.     Right Ear:  Tympanic membrane normal.     Left Ear: Tympanic membrane normal.     Nose: Nose normal.     Mouth/Throat:     Mouth: Mucous membranes are moist.  Eyes:     Pupils: Pupils are equal, round, and reactive to light.  Neck:     Vascular: No carotid bruit or JVD.  Cardiovascular:     Rate and Rhythm: Normal rate and regular rhythm.     Heart sounds: Normal heart sounds.  Pulmonary:     Effort: Pulmonary effort is normal. No respiratory distress.     Breath sounds: Normal breath sounds. No wheezing or rales.  Chest:     Chest wall: No tenderness.  Abdominal:     General: Bowel sounds are normal. There is no distension or abdominal bruit.     Palpations: Abdomen is soft. There is no hepatomegaly, splenomegaly, mass or pulsatile mass.     Tenderness: There is no abdominal tenderness.  Musculoskeletal:        General: Normal range of motion.     Cervical back: Normal range of motion and neck supple.  Lymphadenopathy:     Cervical: No cervical adenopathy.  Skin:    General: Skin is warm and dry.  Neurological:     Mental Status: She is alert and oriented to person, place, and time.     Deep Tendon Reflexes: Reflexes are normal and symmetric.  Psychiatric:        Behavior: Behavior normal.        Thought Content: Thought content normal.        Judgment: Judgment normal.    BP 105/72   Pulse 64   Temp 97.9 F (36.6 C) (Skin)   Ht 5' 6 (1.676 m)   Wt 195 lb 12.8 oz (88.8 kg)   BMI 31.60 kg/m   Right hip xray- mild osteoarthritis-Preliminary reading by Ronal Lunger, FNP  [facility]      Assessment & Plan:  Brianna Sanchez comes in today with chief complaint of annual physical   Diagnosis and orders addressed:  1. Mixed hyperlipidemia Low fat diet - atorvastatin  (LIPITOR) 40 MG tablet; Take 1 tablet (40 mg total) by mouth daily.  Dispense: 90 tablet; Refill: 1 - CBC with Differential/Platelet - CMP14+EGFR - Lipid panel  2. GAD (generalized anxiety disorder) - ALPRAZolam   (XANAX ) 1 MG tablet; Take 1 tablet (1 mg  total) by mouth 2 (two) times daily.  Dispense: 60 tablet; Refill: 5  3. Severe episode of recurrent major depressive disorder, without psychotic features (HCC) Stress management - venlafaxine  XR (EFFEXOR -XR) 150 MG 24 hr capsule; TAKE TWO CAPSULES ONCE DAILY WITH BREAKFAST  Dispense: 180 capsule; Refill: 1nt   4. BMI 32.0-32.9,adult Discussed diet and exercise for person with BMI >25 Will recheck weight in 3-6 months   5. Right hip pain Moist heat Stretches -celebrex  200mg  BID #60 2 refills    Labs pending Health Maintenance reviewed Diet and exercise encouraged  Follow up plan: 6 months   Mary-Margaret Gladis, FNP

## 2024-08-22 NOTE — Patient Instructions (Signed)
 Hip Pain The hip is the joint between the upper legs and the lower pelvis. The bones, cartilage, tendons, and muscles of your hip joint support your body and allow you to move around. Hip pain can range from a minor ache to severe pain in one or both of your hips. The pain may be felt on the inside of the hip joint near the groin, or on the outside near the buttocks and upper thigh. You may also have swelling or stiffness in your hip area. Follow these instructions at home: Managing pain, stiffness, and swelling     If told, put ice on the painful area. Put ice in a plastic bag. Place a towel between your skin and the bag. Leave the ice on for 20 minutes, 2-3 times a day. If told, apply heat to the affected area as often as told by your health care provider. Use the heat source that your provider recommends, such as a moist heat pack or a heating pad. Place a towel between your skin and the heat source. Leave the heat on for 20-30 minutes. If your skin turns bright red, remove the ice or heat right away to prevent skin damage. The risk of damage is higher if you cannot feel pain, heat, or cold. Activity Do exercises as told by your provider. Avoid activities that cause pain. General instructions  Take over-the-counter and prescription medicines only as told by your provider. Keep a journal of your symptoms. Write down: How often you have hip pain. The location of your pain. What the pain feels like. What makes the pain worse. Sleep with a pillow between your legs on your most comfortable side. Keep all follow-up visits. Your provider will monitor your pain and activity. Contact a health care provider if: You cannot put weight on your leg. Your pain or swelling gets worse after a week. It gets harder to walk. You have a fever. Get help right away if: You fall. You have a sudden increase in pain and swelling in your hip. Your hip is red or swollen or very tender to touch. This  information is not intended to replace advice given to you by your health care provider. Make sure you discuss any questions you have with your health care provider. Document Revised: 08/09/2022 Document Reviewed: 08/09/2022 Elsevier Patient Education  2024 [employer].

## 2024-08-22 NOTE — Addendum Note (Signed)
 Addended by: VIKTORIA ALAN MATSU on: 08/22/2024 11:27 AM   Modules accepted: Orders

## 2024-08-23 ENCOUNTER — Ambulatory Visit: Payer: Self-pay | Admitting: Nurse Practitioner

## 2024-08-23 LAB — CBC WITH DIFFERENTIAL/PLATELET
Basophils Absolute: 0 x10E3/uL (ref 0.0–0.2)
Basos: 1 %
EOS (ABSOLUTE): 0.1 x10E3/uL (ref 0.0–0.4)
Eos: 1 %
Hematocrit: 42.2 % (ref 34.0–46.6)
Hemoglobin: 13.9 g/dL (ref 11.1–15.9)
Immature Grans (Abs): 0 x10E3/uL (ref 0.0–0.1)
Immature Granulocytes: 0 %
Lymphocytes Absolute: 2.1 x10E3/uL (ref 0.7–3.1)
Lymphs: 35 %
MCH: 30.4 pg (ref 26.6–33.0)
MCHC: 32.9 g/dL (ref 31.5–35.7)
MCV: 92 fL (ref 79–97)
Monocytes Absolute: 0.5 x10E3/uL (ref 0.1–0.9)
Monocytes: 8 %
Neutrophils Absolute: 3.2 x10E3/uL (ref 1.4–7.0)
Neutrophils: 55 %
Platelets: 378 x10E3/uL (ref 150–450)
RBC: 4.57 x10E6/uL (ref 3.77–5.28)
RDW: 12.7 % (ref 11.7–15.4)
WBC: 5.8 x10E3/uL (ref 3.4–10.8)

## 2024-08-23 LAB — CMP14+EGFR
ALT: 20 IU/L (ref 0–32)
AST: 15 IU/L (ref 0–40)
Albumin: 4.4 g/dL (ref 3.8–4.9)
Alkaline Phosphatase: 111 IU/L (ref 44–121)
BUN/Creatinine Ratio: 20 (ref 9–23)
BUN: 14 mg/dL (ref 6–24)
Bilirubin Total: 0.3 mg/dL (ref 0.0–1.2)
CO2: 23 mmol/L (ref 20–29)
Calcium: 9.7 mg/dL (ref 8.7–10.2)
Chloride: 103 mmol/L (ref 96–106)
Creatinine, Ser: 0.7 mg/dL (ref 0.57–1.00)
Globulin, Total: 2.7 g/dL (ref 1.5–4.5)
Glucose: 93 mg/dL (ref 70–99)
Potassium: 4.8 mmol/L (ref 3.5–5.2)
Sodium: 140 mmol/L (ref 134–144)
Total Protein: 7.1 g/dL (ref 6.0–8.5)
eGFR: 100 mL/min/1.73 (ref >59)

## 2024-08-23 LAB — LIPID PANEL
Cholesterol, Total: 145 mg/dL (ref 100–199)
HDL: 44 mg/dL (ref >39)
LDL CALC COMMENT:: 3.3 ratio (ref 0.0–4.4)
LDL Chol Calc (NIH): 82 mg/dL (ref 0–99)
Triglycerides: 100 mg/dL (ref 0–149)
VLDL Cholesterol Cal: 19 mg/dL (ref 5–40)

## 2024-08-31 ENCOUNTER — Other Ambulatory Visit: Payer: Self-pay | Admitting: Nurse Practitioner

## 2024-08-31 DIAGNOSIS — F332 Major depressive disorder, recurrent severe without psychotic features: Secondary | ICD-10-CM

## 2024-08-31 DIAGNOSIS — E782 Mixed hyperlipidemia: Secondary | ICD-10-CM

## 2024-12-24 ENCOUNTER — Ambulatory Visit

## 2025-01-27 ENCOUNTER — Inpatient Hospital Stay: Admission: RE | Admit: 2025-01-27

## 2025-02-14 ENCOUNTER — Ambulatory Visit: Payer: Self-pay | Admitting: Nurse Practitioner
